# Patient Record
Sex: Female | Born: 1950 | Race: White | Hispanic: No | Marital: Married | Smoking: Never smoker
Health system: Southern US, Community
[De-identification: ages and names within clinical notes are randomized; demographics above are authoritative.]

## PROBLEM LIST (undated history)

## (undated) DIAGNOSIS — M810 Age-related osteoporosis without current pathological fracture: Secondary | ICD-10-CM

## (undated) DIAGNOSIS — I2510 Atherosclerotic heart disease of native coronary artery without angina pectoris: Secondary | ICD-10-CM

## (undated) DIAGNOSIS — I10 Essential (primary) hypertension: Secondary | ICD-10-CM

## (undated) DIAGNOSIS — E785 Hyperlipidemia, unspecified: Secondary | ICD-10-CM

## (undated) HISTORY — PX: CORONARY ANGIOPLASTY WITH STENT PLACEMENT: SHX49

## (undated) HISTORY — DX: Age-related osteoporosis without current pathological fracture: M81.0

---

## 2008-09-19 ENCOUNTER — Other Ambulatory Visit: Admission: RE | Admit: 2008-09-19 | Discharge: 2008-09-19 | Payer: Self-pay | Admitting: Family Medicine

## 2008-10-17 ENCOUNTER — Encounter: Admission: RE | Admit: 2008-10-17 | Discharge: 2008-10-17 | Payer: Self-pay | Admitting: Family Medicine

## 2009-10-18 ENCOUNTER — Encounter: Admission: RE | Admit: 2009-10-18 | Discharge: 2009-10-18 | Payer: Self-pay | Admitting: Family Medicine

## 2010-08-18 ENCOUNTER — Inpatient Hospital Stay (HOSPITAL_COMMUNITY)
Admission: EM | Admit: 2010-08-18 | Discharge: 2010-08-21 | Payer: Self-pay | Source: Home / Self Care | Attending: Internal Medicine | Admitting: Internal Medicine

## 2010-08-22 ENCOUNTER — Inpatient Hospital Stay (HOSPITAL_COMMUNITY)
Admission: EM | Admit: 2010-08-22 | Discharge: 2010-08-23 | Payer: Self-pay | Source: Home / Self Care | Attending: Cardiology | Admitting: Cardiology

## 2010-09-13 ENCOUNTER — Other Ambulatory Visit
Admission: RE | Admit: 2010-09-13 | Discharge: 2010-09-13 | Payer: Self-pay | Source: Home / Self Care | Admitting: Internal Medicine

## 2010-09-20 ENCOUNTER — Encounter (HOSPITAL_COMMUNITY)
Admission: RE | Admit: 2010-09-20 | Discharge: 2010-10-02 | Payer: Self-pay | Source: Home / Self Care | Attending: Cardiology | Admitting: Cardiology

## 2010-09-22 ENCOUNTER — Other Ambulatory Visit: Payer: Self-pay | Admitting: Internal Medicine

## 2010-09-22 DIAGNOSIS — Z1239 Encounter for other screening for malignant neoplasm of breast: Secondary | ICD-10-CM

## 2010-10-03 ENCOUNTER — Encounter (HOSPITAL_COMMUNITY): Payer: BC Managed Care – PPO | Attending: Cardiology

## 2010-10-03 ENCOUNTER — Encounter (HOSPITAL_COMMUNITY): Payer: BC Managed Care – PPO

## 2010-10-03 DIAGNOSIS — M949 Disorder of cartilage, unspecified: Secondary | ICD-10-CM | POA: Insufficient documentation

## 2010-10-03 DIAGNOSIS — I10 Essential (primary) hypertension: Secondary | ICD-10-CM | POA: Insufficient documentation

## 2010-10-03 DIAGNOSIS — Z9861 Coronary angioplasty status: Secondary | ICD-10-CM | POA: Insufficient documentation

## 2010-10-03 DIAGNOSIS — I471 Supraventricular tachycardia, unspecified: Secondary | ICD-10-CM | POA: Insufficient documentation

## 2010-10-03 DIAGNOSIS — E785 Hyperlipidemia, unspecified: Secondary | ICD-10-CM | POA: Insufficient documentation

## 2010-10-03 DIAGNOSIS — I2510 Atherosclerotic heart disease of native coronary artery without angina pectoris: Secondary | ICD-10-CM | POA: Insufficient documentation

## 2010-10-03 DIAGNOSIS — Z5189 Encounter for other specified aftercare: Secondary | ICD-10-CM | POA: Insufficient documentation

## 2010-10-03 DIAGNOSIS — M899 Disorder of bone, unspecified: Secondary | ICD-10-CM | POA: Insufficient documentation

## 2010-10-03 DIAGNOSIS — Z7982 Long term (current) use of aspirin: Secondary | ICD-10-CM | POA: Insufficient documentation

## 2010-10-05 ENCOUNTER — Encounter (HOSPITAL_COMMUNITY): Payer: BC Managed Care – PPO

## 2010-10-08 ENCOUNTER — Encounter (HOSPITAL_COMMUNITY): Payer: BC Managed Care – PPO

## 2010-10-10 ENCOUNTER — Encounter (HOSPITAL_COMMUNITY): Payer: BC Managed Care – PPO

## 2010-10-12 ENCOUNTER — Encounter (HOSPITAL_COMMUNITY): Payer: BC Managed Care – PPO

## 2010-10-15 ENCOUNTER — Encounter (HOSPITAL_COMMUNITY): Payer: BC Managed Care – PPO

## 2010-10-17 ENCOUNTER — Encounter (HOSPITAL_COMMUNITY): Payer: BC Managed Care – PPO

## 2010-10-19 ENCOUNTER — Encounter (HOSPITAL_COMMUNITY): Payer: BC Managed Care – PPO

## 2010-10-22 ENCOUNTER — Encounter (HOSPITAL_COMMUNITY): Payer: BC Managed Care – PPO

## 2010-10-22 ENCOUNTER — Other Ambulatory Visit: Payer: Self-pay

## 2010-10-22 ENCOUNTER — Ambulatory Visit: Payer: Self-pay

## 2010-10-23 ENCOUNTER — Ambulatory Visit
Admission: RE | Admit: 2010-10-23 | Discharge: 2010-10-23 | Disposition: A | Discharge status: Home / Self Care | Payer: BC Managed Care – PPO | Source: Ambulatory Visit | Attending: Internal Medicine | Admitting: Internal Medicine

## 2010-10-23 DIAGNOSIS — Z1239 Encounter for other screening for malignant neoplasm of breast: Secondary | ICD-10-CM

## 2010-10-24 ENCOUNTER — Encounter (HOSPITAL_COMMUNITY): Payer: BC Managed Care – PPO

## 2010-10-26 ENCOUNTER — Encounter (HOSPITAL_COMMUNITY): Payer: BC Managed Care – PPO

## 2010-10-29 ENCOUNTER — Encounter (HOSPITAL_COMMUNITY): Payer: BC Managed Care – PPO

## 2010-10-31 ENCOUNTER — Encounter (HOSPITAL_COMMUNITY): Payer: BC Managed Care – PPO

## 2010-11-01 NOTE — Consult Note (Signed)
Allison Downs, Allison Downs.:  [id number]  MEDICAL RECORD Downs.:  [id number]          PATIENT TYPE:  INP  LOCATION:  3702                         FACILITY:  [facility]  PHYSICIAN:  Learta Codding, MD,FACC DATE OF BIRTH:  03-16-1951  DATE OF CONSULTATION:  08/18/2010 DATE OF DISCHARGE:                                CONSULTATION   REFERRING PHYSICIAN:  Celene Kras, MD, in the emergency room.  REASON FOR CONSULTATION:  Evaluation of exertional substernal chest pain.  HISTORY OF PRESENT ILLNESS:  The patient is a very pleasant 60 year old female with Downs significant prior medical history.  The patient over the last several day started to experience episodes of substernal chest heaviness.  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  After she started walking at slow pace it went away. The pain then however came back intermittently in episodes of 5 minutes. Because of ongoing chest pain, the patient called her primary care physician at [facility] and was seen.  Everything checked out apparently within normal limits, but she was told she needed a stress test and was set up with [facility] but could not be seen until January 5.  The patient stated in ensuing days she continues to have substernal chest pain similar in duration and quality as her prior episodes.  This morning when she was walking to the store she developed chest tightness which lasted approximately 20 minutes which she described as moderate in nature approximately 7/10.  There was Downs associated shortness of breath or dizziness, but the pain was radiating into the back.  The rest relieved it.  She was concerned about and then presented to the emergency room.  In the ER, her EKG was within normal limits but she had Downs chest at that point in time.  Her initial blood work is also  within normal limits with normal cardiac markers including a myoglobin and troponin.  Interestingly, the patient has very few cardiac risk factors despite the fact that her histories are rather concerning for angina.  SOCIAL HISTORY:  The patient is a IT consultant.  She denies any tobacco use or alcohol use.  FAMILY HISTORY:  Mother had diabetes mellitus, died at age 10.  Father died in his 90s eventually from a myocardial infarction.  PAST MEDICAL HISTORY:  Notable for hypertension but Downs other significant disease illnesses including the episodes of diabetes mellitus.  The patient also stated that she had previously a normal cholesterol.  She has a history of bunion surgery.  ALLERGIES:  Downs known drug allergies.  REVIEW OF SYSTEMS:  As per HPI.  The patient denies any nausea or vomiting.  Downs fever or chills.  Downs melena or hematochezia.  Downs dysuria or frequency.  Downs palpitations or syncope.  The remainder of review of systems negative apart from pertinent positives.  PHYSICAL EXAMINATION:  VITAL SIGNS:  Blood pressure was 150/90, heart rate is 65 beats per minute, temperature is afebrile. GENERAL:  Well-nourished white female in Downs apparent distress. HEENT:  Pupils, eyes are clear.  Conjunctivae clear.  PERRLA.  EOMI. oropharynx is clear. NECK:  Supple.  Normal carotid upstroke.  Downs carotid bruits.  Downs thyromegaly.  Downs nodular thyroids.  JVP is approximately 5 cm. LYMPHADENOPATHY:  Downs cervical or supraclavicular lymphadenopathy. LUNGS:  Clear breath sounds bilaterally. HEART:  Regular rate and rhythm.  Normal S1 and S2.  Downs murmur, rubs, or gallops. ABDOMEN:  Soft, nontender.  Downs rebound or guarding.  Good bowel sounds. EXTREMITIES:  Downs cyanosis, clubbing or edema. NEURO:  The patient is alert oriented, and grossly nonfocal. SKIN:  Warm and dry. PSYCHIATRIC:  Normal affect.  LABORATORY WORK:  Sodium is 143, potassium is 3.3, chloride is 101, CO2 is 33, glucose 94, BUN is 13,  creatinine 0.84, troponin is less than 1, myoglobin is 62.8.  Urine is within normal limits.  A 12-lead electrocardiogram is within normal limits.  Chest x-ray shows evidence of remote lung disease, mild hyperinflation but Downs infiltrates, edema or effusions.  PROBLEM LIST: 1. Typical exertional chest pain, but in the absence of significant     risk factors. 2. Hypertension, somewhat poorly controlled. 3. Hypokalemia. 4. Downs significant family history of coronary artery disease.  PLAN: 1. The patient's history is rather concerning for exertional angina.     Her chest pain is rather typical, appears to come on with exertion,     has worsened over the last week.  This typically relieved by rest. 2. However, the patient has very few cardiac risk factors and her     pretest probability for coronary artery disease is only     intermediate.  However given her convincing history, I do feel that     the patient would be best served by proceeding with a diagnostic     cardiac catheterization to definitively rule out significant     coronary artery disease. 3. I spent considerable time with her and her husband to discuss the     risks and benefits of a cardiac catheterization versus a     noninvasive strategy based on the results of her troponins and     possible stress test.  The patient clearly was in favor of the     former test.  We discussed potential risks involved with cardiac     catheterization including bleeding, dye allergy myocardial     infarction, arrhythmia and possibly death.  The patient understands     the risk and wants to proceed with cardiac catheterization. 4. At this point year given the patient's low-dose nitroglycerin, I do     not think she needs a heparin per se at this point in time.  If she     has recurrent chest pain, this certainly can be started.  We will     keep her n.p.o. post midnight for diagnostic catheterization on     Monday.     Learta Codding,  MD,FACC     GED/MEDQ  D:  08/18/2010  T:  08/19/2010  Job:  [id number]  cc:   [facility] Hospitalist Service  Electronically Signed by Lewayne Bunting MDFACC on 11/01/2010 10:24:30 AM

## 2010-11-02 ENCOUNTER — Encounter (HOSPITAL_COMMUNITY): Payer: BC Managed Care – PPO | Attending: Cardiology

## 2010-11-02 DIAGNOSIS — E785 Hyperlipidemia, unspecified: Secondary | ICD-10-CM | POA: Insufficient documentation

## 2010-11-02 DIAGNOSIS — I471 Supraventricular tachycardia, unspecified: Secondary | ICD-10-CM | POA: Insufficient documentation

## 2010-11-02 DIAGNOSIS — M899 Disorder of bone, unspecified: Secondary | ICD-10-CM | POA: Insufficient documentation

## 2010-11-02 DIAGNOSIS — I2510 Atherosclerotic heart disease of native coronary artery without angina pectoris: Secondary | ICD-10-CM | POA: Insufficient documentation

## 2010-11-02 DIAGNOSIS — Z5189 Encounter for other specified aftercare: Secondary | ICD-10-CM | POA: Insufficient documentation

## 2010-11-02 DIAGNOSIS — I10 Essential (primary) hypertension: Secondary | ICD-10-CM | POA: Insufficient documentation

## 2010-11-02 DIAGNOSIS — Z9861 Coronary angioplasty status: Secondary | ICD-10-CM | POA: Insufficient documentation

## 2010-11-02 DIAGNOSIS — Z7982 Long term (current) use of aspirin: Secondary | ICD-10-CM | POA: Insufficient documentation

## 2010-11-05 ENCOUNTER — Encounter (HOSPITAL_COMMUNITY): Payer: BC Managed Care – PPO

## 2010-11-07 ENCOUNTER — Encounter (HOSPITAL_COMMUNITY): Payer: BC Managed Care – PPO

## 2010-11-09 ENCOUNTER — Encounter (HOSPITAL_COMMUNITY): Payer: BC Managed Care – PPO

## 2010-11-12 ENCOUNTER — Encounter (HOSPITAL_COMMUNITY): Payer: BC Managed Care – PPO

## 2010-11-12 LAB — TROPONIN I: Troponin I: 0.02 ng/mL (ref 0.00–0.06)

## 2010-11-12 LAB — POCT I-STAT, CHEM 8
Calcium, Ion: 1.02 mmol/L — ABNORMAL LOW (ref 1.12–1.32)
Creatinine, Ser: 0.9 mg/dL (ref 0.4–1.2)
Glucose, Bld: 118 mg/dL — ABNORMAL HIGH (ref 70–99)
Hemoglobin: 12.9 g/dL (ref 12.0–15.0)
Sodium: 139 mEq/L (ref 135–145)
TCO2: 25 mmol/L (ref 0–100)

## 2010-11-12 LAB — BASIC METABOLIC PANEL
CO2: 25 mEq/L (ref 19–32)
CO2: 30 mEq/L (ref 19–32)
Calcium: 10.5 mg/dL (ref 8.4–10.5)
Calcium: 8.3 mg/dL — ABNORMAL LOW (ref 8.4–10.5)
Chloride: 105 mEq/L (ref 96–112)
Chloride: 106 mEq/L (ref 96–112)
Chloride: 108 mEq/L (ref 96–112)
Creatinine, Ser: 0.84 mg/dL (ref 0.4–1.2)
Creatinine, Ser: 0.89 mg/dL (ref 0.4–1.2)
GFR calc Af Amer: >60 mL/min (ref >60)
GFR calc Af Amer: >60 mL/min (ref >60)
GFR calc Af Amer: >60 mL/min (ref >60)
GFR calc non Af Amer: >60 mL/min (ref >60)
Glucose, Bld: 102 mg/dL — ABNORMAL HIGH (ref 70–99)
Potassium: 3.6 mEq/L (ref 3.5–5.1)
Sodium: 140 mEq/L (ref 135–145)
Sodium: 141 mEq/L (ref 135–145)

## 2010-11-12 LAB — CBC
Hemoglobin: 11.1 g/dL — ABNORMAL LOW (ref 12.0–15.0)
MCH: 29.3 pg (ref 26.0–34.0)
MCH: 29.4 pg (ref 26.0–34.0)
MCHC: 34 g/dL (ref 30.0–36.0)
MCV: 86.2 fL (ref 78.0–100.0)
Platelets: 238 10*3/uL (ref 150–400)
Platelets: 245 10*3/uL (ref 150–400)
RBC: 4.1 MIL/uL (ref 3.87–5.11)
RBC: 4.71 MIL/uL (ref 3.87–5.11)
WBC: 6.2 10*3/uL (ref 4.0–10.5)
WBC: 7.4 10*3/uL (ref 4.0–10.5)

## 2010-11-12 LAB — CARDIAC PANEL(CRET KIN+CKTOT+MB+TROPI)
Relative Index: INVALID (ref 0.0–2.5)
Relative Index: INVALID (ref 0.0–2.5)
Relative Index: INVALID (ref 0.0–2.5)
Total CK: 34 U/L (ref 7–177)
Total CK: 41 U/L (ref 7–177)
Total CK: 60 U/L (ref 7–177)
Troponin I: 0.01 ng/mL (ref 0.00–0.06)
Troponin I: 0.02 ng/mL (ref 0.00–0.06)
Troponin I: 0.06 ng/mL (ref 0.00–0.06)

## 2010-11-12 LAB — DIFFERENTIAL
Eosinophils Absolute: 0 10*3/uL (ref 0.0–0.7)
Lymphocytes Relative: 20 % (ref 12–46)
Lymphs Abs: 1.5 10*3/uL (ref 0.7–4.0)
Neutro Abs: 5.4 10*3/uL (ref 1.7–7.7)
Neutrophils Relative %: 73 % (ref 43–77)

## 2010-11-12 LAB — POCT CARDIAC MARKERS
Myoglobin, poc: 52.8 ng/mL (ref 12–200)
Myoglobin, poc: 79.7 ng/mL (ref 12–200)

## 2010-11-12 LAB — URINALYSIS, ROUTINE W REFLEX MICROSCOPIC
Glucose, UA: NEGATIVE mg/dL
Ketones, ur: NEGATIVE mg/dL
Protein, ur: NEGATIVE mg/dL

## 2010-11-12 LAB — CK TOTAL AND CKMB (NOT AT ARMC)
CK, MB: 0.7 ng/mL (ref 0.3–4.0)
CK, MB: 1 ng/mL (ref 0.3–4.0)
Relative Index: INVALID (ref 0.0–2.5)
Total CK: 106 U/L (ref 7–177)
Total CK: 48 U/L (ref 7–177)

## 2010-11-12 LAB — LIPID PANEL
Cholesterol: 207 mg/dL — ABNORMAL HIGH (ref 0–200)
LDL Cholesterol: 130 mg/dL — ABNORMAL HIGH (ref 0–99)
Triglycerides: 91 mg/dL (ref <150)
VLDL: 18 mg/dL (ref 0–40)

## 2010-11-12 LAB — URINE CULTURE
Colony Count: NO GROWTH
Culture: NO GROWTH

## 2010-11-12 LAB — PROTIME-INR
INR: 0.96 (ref 0.00–1.49)
Prothrombin Time: 13 seconds (ref 11.6–15.2)

## 2010-11-14 ENCOUNTER — Encounter (HOSPITAL_COMMUNITY): Payer: BC Managed Care – PPO

## 2010-11-16 ENCOUNTER — Encounter (HOSPITAL_COMMUNITY): Payer: BC Managed Care – PPO

## 2010-11-19 ENCOUNTER — Encounter (HOSPITAL_COMMUNITY): Payer: BC Managed Care – PPO

## 2010-11-21 ENCOUNTER — Encounter (HOSPITAL_COMMUNITY): Payer: BC Managed Care – PPO

## 2010-11-23 ENCOUNTER — Encounter (HOSPITAL_COMMUNITY): Payer: BC Managed Care – PPO

## 2010-11-26 ENCOUNTER — Encounter (HOSPITAL_COMMUNITY): Payer: BC Managed Care – PPO

## 2010-11-28 ENCOUNTER — Encounter (HOSPITAL_COMMUNITY): Payer: BC Managed Care – PPO

## 2010-11-30 ENCOUNTER — Encounter (HOSPITAL_COMMUNITY): Payer: BC Managed Care – PPO

## 2010-12-03 ENCOUNTER — Encounter (HOSPITAL_COMMUNITY): Payer: BC Managed Care – PPO

## 2010-12-05 ENCOUNTER — Encounter (HOSPITAL_COMMUNITY): Payer: BC Managed Care – PPO | Attending: Cardiology

## 2010-12-05 DIAGNOSIS — E785 Hyperlipidemia, unspecified: Secondary | ICD-10-CM | POA: Insufficient documentation

## 2010-12-05 DIAGNOSIS — Z5189 Encounter for other specified aftercare: Secondary | ICD-10-CM | POA: Insufficient documentation

## 2010-12-05 DIAGNOSIS — Z9861 Coronary angioplasty status: Secondary | ICD-10-CM | POA: Insufficient documentation

## 2010-12-05 DIAGNOSIS — I471 Supraventricular tachycardia, unspecified: Secondary | ICD-10-CM | POA: Insufficient documentation

## 2010-12-05 DIAGNOSIS — M899 Disorder of bone, unspecified: Secondary | ICD-10-CM | POA: Insufficient documentation

## 2010-12-05 DIAGNOSIS — Z7982 Long term (current) use of aspirin: Secondary | ICD-10-CM | POA: Insufficient documentation

## 2010-12-05 DIAGNOSIS — I2510 Atherosclerotic heart disease of native coronary artery without angina pectoris: Secondary | ICD-10-CM | POA: Insufficient documentation

## 2010-12-05 DIAGNOSIS — I10 Essential (primary) hypertension: Secondary | ICD-10-CM | POA: Insufficient documentation

## 2010-12-05 DIAGNOSIS — M949 Disorder of cartilage, unspecified: Secondary | ICD-10-CM | POA: Insufficient documentation

## 2010-12-07 ENCOUNTER — Encounter (HOSPITAL_COMMUNITY): Payer: BC Managed Care – PPO

## 2010-12-10 ENCOUNTER — Encounter (HOSPITAL_COMMUNITY): Payer: BC Managed Care – PPO

## 2010-12-12 ENCOUNTER — Encounter (HOSPITAL_COMMUNITY): Payer: BC Managed Care – PPO

## 2010-12-14 ENCOUNTER — Encounter (HOSPITAL_COMMUNITY): Payer: BC Managed Care – PPO

## 2010-12-17 ENCOUNTER — Encounter (HOSPITAL_COMMUNITY): Payer: BC Managed Care – PPO

## 2010-12-19 ENCOUNTER — Encounter (HOSPITAL_COMMUNITY): Payer: BC Managed Care – PPO

## 2010-12-21 ENCOUNTER — Encounter (HOSPITAL_COMMUNITY): Payer: BC Managed Care – PPO

## 2010-12-24 ENCOUNTER — Encounter (HOSPITAL_COMMUNITY): Payer: BC Managed Care – PPO

## 2010-12-26 ENCOUNTER — Encounter (HOSPITAL_COMMUNITY): Payer: BC Managed Care – PPO

## 2010-12-28 ENCOUNTER — Encounter (HOSPITAL_COMMUNITY): Payer: BC Managed Care – PPO

## 2011-09-17 ENCOUNTER — Other Ambulatory Visit (HOSPITAL_COMMUNITY)
Admission: RE | Admit: 2011-09-17 | Discharge: 2011-09-17 | Disposition: A | Discharge status: Home / Self Care | Payer: BC Managed Care – PPO | Source: Ambulatory Visit | Attending: Family Medicine | Admitting: Family Medicine

## 2011-09-17 DIAGNOSIS — Z01419 Encounter for gynecological examination (general) (routine) without abnormal findings: Secondary | ICD-10-CM | POA: Insufficient documentation

## 2011-09-19 ENCOUNTER — Other Ambulatory Visit: Payer: Self-pay | Admitting: Family Medicine

## 2011-09-19 DIAGNOSIS — Z1231 Encounter for screening mammogram for malignant neoplasm of breast: Secondary | ICD-10-CM

## 2011-10-25 ENCOUNTER — Ambulatory Visit
Admission: RE | Admit: 2011-10-25 | Discharge: 2011-10-25 | Disposition: A | Discharge status: Home / Self Care | Payer: BC Managed Care – PPO | Source: Ambulatory Visit | Attending: Family Medicine | Admitting: Family Medicine

## 2011-10-25 DIAGNOSIS — Z1231 Encounter for screening mammogram for malignant neoplasm of breast: Secondary | ICD-10-CM

## 2011-10-29 ENCOUNTER — Other Ambulatory Visit: Payer: Self-pay | Admitting: Family Medicine

## 2011-10-29 DIAGNOSIS — R928 Other abnormal and inconclusive findings on diagnostic imaging of breast: Secondary | ICD-10-CM

## 2011-11-07 ENCOUNTER — Other Ambulatory Visit: Payer: Self-pay | Admitting: Family Medicine

## 2011-11-07 ENCOUNTER — Ambulatory Visit
Admission: RE | Admit: 2011-11-07 | Discharge: 2011-11-07 | Disposition: A | Discharge status: Home / Self Care | Payer: BC Managed Care – PPO | Source: Ambulatory Visit | Attending: Family Medicine | Admitting: Family Medicine

## 2011-11-07 DIAGNOSIS — R928 Other abnormal and inconclusive findings on diagnostic imaging of breast: Secondary | ICD-10-CM

## 2012-06-14 ENCOUNTER — Emergency Department (HOSPITAL_COMMUNITY)
Admission: EM | Admit: 2012-06-14 | Discharge: 2012-06-14 | Disposition: A | Discharge status: Home / Self Care | Payer: BC Managed Care – PPO | Source: Home / Self Care

## 2012-06-14 ENCOUNTER — Encounter (HOSPITAL_COMMUNITY): Payer: Self-pay | Admitting: *Deleted

## 2012-06-14 DIAGNOSIS — S60219A Contusion of unspecified wrist, initial encounter: Secondary | ICD-10-CM

## 2012-06-14 HISTORY — DX: Essential (primary) hypertension: I10

## 2012-06-14 HISTORY — DX: Hyperlipidemia, unspecified: E78.5

## 2012-06-14 HISTORY — DX: Atherosclerotic heart disease of native coronary artery without angina pectoris: I25.10

## 2012-06-14 NOTE — ED Notes (Signed)
Drove back from [place] today; this afternoon noticed bruise and swelling to right wrist area without known injury.  Pt is currently on clopidogrel; states area has not continued to grow - feels it may have decreased some after ice application.  Denies pain.

## 2012-06-14 NOTE — ED Provider Notes (Signed)
Medical screening examination/treatment/procedure(s) were performed by resident physician or non-physician practitioner and as supervising physician I was immediately available for consultation/collaboration.   KINDL,JAMES DOUGLAS MD.    James D Kindl, MD 06/14/12 1922 

## 2012-06-14 NOTE — ED Provider Notes (Signed)
History     CSN: [id number]  Arrival date & time 06/14/12  1655   None     Chief Complaint  Patient presents with  . Hematoma     (Consider location/radiation/quality/duration/timing/severity/associated sxs/prior treatment) HPI Comments: Pleasant 61 year old female noticed a discolored swollen area to her right dorsal wrist today. She is on Plavix and states she bruises easily. He doesn't remember striking her right wrist on anything but did notice the hematoma this afternoon around 2:00. She comes for her a diagnosis and reassurance. She denies wrist pain.   Past Medical History  Diagnosis Date  . Hypertension   . Coronary artery disease   . Hyperlipemia     Past Surgical History  Procedure Date  . Coronary angioplasty with stent placement     No family history on file.  History  Substance Use Topics  . Smoking status: Not on file  . Smokeless tobacco: Not on file  . Alcohol Use: No    OB History    Grav Para Term Preterm Abortions TAB SAB Ect Mult Living                  Review of Systems  Constitutional: Negative for fever, chills and activity change.  HENT: Negative.   Respiratory: Negative.   Cardiovascular: Negative.   Musculoskeletal: Negative for back pain, joint swelling, arthralgias and gait problem.       As per HPI  Skin: Positive for color change. Negative for pallor and rash.       As per history of present illness  Neurological: Negative.     Allergies  Augmentin  Home Medications   Current Outpatient Rx  Name Route Sig Dispense Refill  . ALENDRONATE SODIUM 70 MG PO TABS Oral Take 70 mg by mouth every 7 (seven) days. Take with a full glass of water on an empty stomach.    . CLOPIDOGREL BISULFATE 75 MG PO TABS Oral Take 75 mg by mouth daily.    . ISOSORBIDE MONONITRATE ER 30 MG PO TB24 Oral Take 30 mg by mouth daily.    Marland Kitchen LISINOPRIL 20 MG PO TABS Oral Take 20 mg by mouth 2 (two) times daily.    Marland Kitchen METOPROLOL SUCCINATE ER 25 MG PO TB24  Oral Take 25 mg by mouth 2 (two) times daily. 1/2 tab    . SIMVASTATIN 20 MG PO TABS Oral Take 20 mg by mouth every evening.      BP 182/70  Pulse 99  Temp 98.5 F (36.9 C) (Oral)  Resp 18  SpO2 100%  Physical Exam  Constitutional: She is oriented to person, place, and time. She appears well-developed and well-nourished. No distress.  HENT:  Head: Normocephalic and atraumatic.  Eyes: EOM are normal. Pupils are equal, round, and reactive to light.  Neck: Normal range of motion. Neck supple.  Lymphadenopathy:    She has no cervical adenopathy.  Neurological: She is alert and oriented to person, place, and time. No cranial nerve deficit.  Skin: Skin is warm and dry.       Slightly bluish area of swelling to the dorsum of the right wrist. This area is soft and nontender. It is well circumscribed.    ED Course  Procedures (including critical care time)  Labs Reviewed - No data to display No results found.   1. Traumatic hematoma of wrist       MDM   reassurance May apply ice packs and pressure off and on tonight. It will  take several days to couple weeks for the hematoma to resolve.         Hayden Rasmussen, NP 06/14/12 1816

## 2012-09-28 ENCOUNTER — Other Ambulatory Visit (HOSPITAL_COMMUNITY)
Admission: RE | Admit: 2012-09-28 | Discharge: 2012-09-28 | Disposition: A | Discharge status: Home / Self Care | Payer: BC Managed Care – PPO | Source: Ambulatory Visit | Attending: Family Medicine | Admitting: Family Medicine

## 2012-09-28 ENCOUNTER — Other Ambulatory Visit: Payer: Self-pay | Admitting: Family Medicine

## 2012-09-28 DIAGNOSIS — Z0000 Encounter for general adult medical examination without abnormal findings: Secondary | ICD-10-CM | POA: Insufficient documentation

## 2012-09-28 DIAGNOSIS — M810 Age-related osteoporosis without current pathological fracture: Secondary | ICD-10-CM

## 2012-09-28 DIAGNOSIS — Z780 Asymptomatic menopausal state: Secondary | ICD-10-CM

## 2012-09-28 DIAGNOSIS — Z1231 Encounter for screening mammogram for malignant neoplasm of breast: Secondary | ICD-10-CM

## 2012-11-10 ENCOUNTER — Ambulatory Visit
Admission: RE | Admit: 2012-11-10 | Discharge: 2012-11-10 | Disposition: A | Discharge status: Home / Self Care | Payer: BC Managed Care – PPO | Source: Ambulatory Visit | Attending: Family Medicine | Admitting: Family Medicine

## 2012-11-10 DIAGNOSIS — Z780 Asymptomatic menopausal state: Secondary | ICD-10-CM

## 2012-11-10 DIAGNOSIS — Z1231 Encounter for screening mammogram for malignant neoplasm of breast: Secondary | ICD-10-CM

## 2012-11-10 DIAGNOSIS — M810 Age-related osteoporosis without current pathological fracture: Secondary | ICD-10-CM

## 2013-07-09 ENCOUNTER — Telehealth: Payer: Self-pay | Admitting: Cardiology

## 2013-07-09 NOTE — Telephone Encounter (Signed)
New message    Need pres faxed to [facility] script-----simvastatin---77mo supply

## 2013-07-12 ENCOUNTER — Other Ambulatory Visit: Payer: Self-pay

## 2013-07-12 ENCOUNTER — Telehealth: Payer: Self-pay

## 2013-07-12 MED ORDER — SIMVASTATIN 20 MG PO TABS: 20.0000 mg | ORAL_TABLET | Freq: Every evening | ORAL | Status: DC

## 2013-07-12 NOTE — Telephone Encounter (Signed)
Rx refilled.

## 2013-07-12 NOTE — Telephone Encounter (Signed)
See note

## 2013-09-27 ENCOUNTER — Telehealth: Payer: Self-pay

## 2013-09-27 ENCOUNTER — Other Ambulatory Visit: Payer: Self-pay

## 2013-09-27 MED ORDER — LISINOPRIL 20 MG PO TABS: 20.0000 mg | ORAL_TABLET | Freq: Two times a day (BID) | ORAL | Status: DC

## 2013-09-27 NOTE — Telephone Encounter (Signed)
Medication refilled by Rose earlier today.

## 2013-09-27 NOTE — Telephone Encounter (Signed)
You can refill. Pt takes lisinopril 20 mg twice a day with 6 refills. Pt should have an appt for follow up 7/15

## 2013-10-08 ENCOUNTER — Other Ambulatory Visit: Payer: Self-pay

## 2013-10-08 DIAGNOSIS — Z1231 Encounter for screening mammogram for malignant neoplasm of breast: Secondary | ICD-10-CM

## 2013-10-20 ENCOUNTER — Other Ambulatory Visit: Payer: Self-pay | Admitting: Cardiology

## 2013-10-27 ENCOUNTER — Telehealth: Payer: Self-pay

## 2013-10-29 ENCOUNTER — Other Ambulatory Visit: Payer: Self-pay

## 2013-10-29 MED ORDER — METOPROLOL SUCCINATE ER 25 MG PO TB24: ORAL_TABLET | ORAL | Status: DC

## 2013-11-02 ENCOUNTER — Other Ambulatory Visit: Payer: Self-pay

## 2013-11-02 MED ORDER — METOPROLOL TARTRATE 25 MG PO TABS: ORAL_TABLET | ORAL | Status: DC

## 2013-11-02 NOTE — Telephone Encounter (Signed)
Rx has been filled 

## 2013-11-11 ENCOUNTER — Ambulatory Visit
Admission: RE | Admit: 2013-11-11 | Discharge: 2013-11-11 | Disposition: A | Discharge status: Home / Self Care | Payer: BC Managed Care – PPO | Source: Ambulatory Visit

## 2013-11-11 DIAGNOSIS — Z1231 Encounter for screening mammogram for malignant neoplasm of breast: Secondary | ICD-10-CM

## 2013-12-08 ENCOUNTER — Other Ambulatory Visit: Payer: Self-pay | Admitting: *Deleted

## 2013-12-08 NOTE — Telephone Encounter (Signed)
Patient calling to have clopidogrel 75 refilled.  Will route message to Dr [name] nurse

## 2013-12-10 MED ORDER — CLOPIDOGREL BISULFATE 75 MG PO TABS: 75.0000 mg | ORAL_TABLET | Freq: Every day | ORAL | Status: DC

## 2014-02-01 ENCOUNTER — Other Ambulatory Visit: Payer: Self-pay | Admitting: *Deleted

## 2014-02-01 DIAGNOSIS — E7800 Pure hypercholesterolemia, unspecified: Secondary | ICD-10-CM

## 2014-02-01 DIAGNOSIS — Z79899 Other long term (current) drug therapy: Secondary | ICD-10-CM

## 2014-03-02 ENCOUNTER — Other Ambulatory Visit (INDEPENDENT_AMBULATORY_CARE_PROVIDER_SITE_OTHER): Payer: BC Managed Care – PPO

## 2014-03-02 DIAGNOSIS — Z79899 Other long term (current) drug therapy: Secondary | ICD-10-CM

## 2014-03-02 DIAGNOSIS — E7800 Pure hypercholesterolemia, unspecified: Secondary | ICD-10-CM

## 2014-03-02 LAB — HEPATIC FUNCTION PANEL
ALK PHOS: 31 U/L — AB (ref 39–117)
ALT: 21 U/L (ref 0–35)
AST: 27 U/L (ref 0–37)
Albumin: 4 g/dL (ref 3.5–5.2)
BILIRUBIN DIRECT: 0 mg/dL (ref 0.0–0.3)
TOTAL PROTEIN: 6.6 g/dL (ref 6.0–8.3)
Total Bilirubin: 1 mg/dL (ref 0.2–1.2)

## 2014-03-02 LAB — LIPID PANEL
CHOLESTEROL: 164 mg/dL (ref 0–200)
HDL: 60.8 mg/dL (ref >39.00)
LDL Cholesterol: 86 mg/dL (ref 0–99)
NonHDL: 103.2
TRIGLYCERIDES: 86 mg/dL (ref 0.0–149.0)
Total CHOL/HDL Ratio: 3
VLDL: 17.2 mg/dL (ref 0.0–40.0)

## 2014-03-09 ENCOUNTER — Ambulatory Visit (INDEPENDENT_AMBULATORY_CARE_PROVIDER_SITE_OTHER): Payer: BC Managed Care – PPO | Admitting: Cardiology

## 2014-03-09 ENCOUNTER — Encounter (INDEPENDENT_AMBULATORY_CARE_PROVIDER_SITE_OTHER): Payer: Self-pay

## 2014-03-09 ENCOUNTER — Encounter: Payer: Self-pay | Admitting: Cardiology

## 2014-03-09 VITALS — BP 120/76 | HR 48 | Ht 60.0 in | Wt 97.4 lb

## 2014-03-09 DIAGNOSIS — R001 Bradycardia, unspecified: Secondary | ICD-10-CM

## 2014-03-09 DIAGNOSIS — I2510 Atherosclerotic heart disease of native coronary artery without angina pectoris: Secondary | ICD-10-CM

## 2014-03-09 DIAGNOSIS — I10 Essential (primary) hypertension: Secondary | ICD-10-CM

## 2014-03-09 DIAGNOSIS — E7800 Pure hypercholesterolemia, unspecified: Secondary | ICD-10-CM

## 2014-03-09 DIAGNOSIS — I498 Other specified cardiac arrhythmias: Secondary | ICD-10-CM

## 2014-03-09 NOTE — Progress Notes (Signed)
[address] [place], [place]  [address] Phone: [phone] Fax:  [phone]  Date:  03/09/2014   ID:  Allison FritzKathlyn Downs, DOB [birth year], MRN [id number]  PCP:  Allison SaupeFULP, CAMMIE, MD   History of Present Illness: Allison Downs is a 63 y.o. female with history of CAD 12/11, DES to LAD at the bifurcation of the first diagonal branch who came back to the hospital the subsequent day and was ruled out for MI, and HTN.  She is doing very well. Active. Hiking. No anginal symptoms with moderate exertion. She is on both Plavix and aspirin. We had discussion about the possible discontinuation of Plavix however given the position of her stent essentially in the proximal LAD and smaller diameter of the stent a 2.75 mm I would like to continue it at this point. She's not had any bleeding episodes.  Her blood pressure one point was elevated and we increased the lisinopril to 20 mg twice a day. Since then, she has been doing very well.  Her lipids are also under excellent control with simvastatin 20 mg. Lab work reviewed with her..  Patient denies chest pain, palpitations, dizziness, syncope, swelling, nor PND.    Wt Readings from Last 3 Encounters:  03/09/14 97 lb 6.4 oz (44.18 kg)     Past Medical History  Diagnosis Date  . Hypertension   . Osteoporosis   . Coronary artery disease     drug-eluting stent to mid LAD (2.5975mm)at the bifurcation of first diagonal branch .December 19,2011-decided to continue Plavix   . Hyperlipemia     LDL goal less than 70    Past Surgical History  Procedure Laterality Date  . Coronary angioplasty with stent placement      Current Outpatient Prescriptions  Medication Sig Dispense Refill  . alendronate (FOSAMAX) 70 MG tablet Take 70 mg by mouth every 7 (seven) days. Take with a full glass of water on an empty stomach.      Marland Kitchen. aspirin 81 MG EC tablet Take 81 mg by mouth daily. Swallow whole.      . Calcium Carbonate-Vit D-Min (CALCIUM 1200  PO) Take by mouth. Three chewables once daily      . clopidogrel (PLAVIX) 75 MG tablet Take 1 tablet (75 mg total) by mouth daily.  90 tablet  2  . isosorbide mononitrate (IMDUR) 30 MG 24 hr tablet Take 30 mg by mouth daily.      Marland Kitchen. KRILL OIL PO Take by mouth. 300mg  1 tab daily      . lisinopril (PRINIVIL,ZESTRIL) 20 MG tablet Take 1 tablet (20 mg total) by mouth 2 (two) times daily.  180 tablet  3  . metoprolol tartrate (LOPRESSOR) 25 MG tablet Take 1/2 tablet twice a day  180 tablet  3  . montelukast (SINGULAIR) 10 MG tablet 20 mg.       . Multiple Vitamin (MULTI-VITAMIN PO) Take by mouth. Take 1 tab daily      . simvastatin (ZOCOR) 20 MG tablet Take 1 tablet (20 mg total) by mouth every evening.  90 tablet  3   No current facility-administered medications for this visit.    Allergies:    Allergies  Allergen Reactions  . Augmentin [Amoxicillin-Pot Clavulanate] Diarrhea  . Ibuprofen     Upset stomach    Social History:  The patient  reports that she has never smoked. She does not have any smokeless tobacco history on file. She reports that she does  not drink alcohol or use illicit drugs.   Family History  Problem Relation Age of Onset  . Diabetes Mother   . Hypertension Mother   . Coronary artery disease Mother   . Heart attack Father   . Dementia Father   . Coronary artery disease Father     ROS:  Please see the history of present illness.   Denies any syncope, bleeding, orthopnea, PND   All other systems reviewed and negative.   PHYSICAL EXAM: VS:  BP 120/76  Pulse 48  Ht 5' (1.524 m)  Wt 97 lb 6.4 oz (44.18 kg)  BMI 19.02 kg/m2 Thin, in no acute distress HEENT: normal, [place]/AT, EOMI Neck: no JVD, normal carotid upstroke, no bruit Cardiac:  normal S1, S2; RRR; no murmur Lungs:  clear to auscultation bilaterally, no wheezing, rhonchi or rales Abd: soft, nontender, no hepatomegaly, no bruits Ext: no edema, 2+ distal pulses Skin: warm and dry GU: deferred Neuro: no  focal abnormalities noted, AAO x 3  EKG:  03/09/14-sinus bradycardia rate 48 with no other significant abnormalities    Labs: 03/02/14-LDL 86, HDL 60  ASSESSMENT AND PLAN:  1. Coronary artery disease-drug-eluting stent in LAD. Given the proximity of the LAD stent, we will go ahead and continue with Plavix. Aspirin. She is doing well without anginal symptoms. She is on very low-dose beta blockers. Unable to titrate because of bradycardia. Asymptomatic. Isosorbide. 2. Bradycardia-asymptomatic. Low-dose beta blocker. 3. Hyperlipidemia-well-controlled on low-dose simvastatin. LDL 86. Optimal less than 70 4. Hypertension-well-controlled. 5. 1 year follow up.  Signed, Allison SchultzMark Dakarai Mcglocklin, MD [facility]  03/09/2014 9:04 AM

## 2014-03-09 NOTE — Patient Instructions (Signed)
The current medical regimen is effective;  continue present plan and medications.  Follow up in 1 year with Dr Skains.  You will receive a letter in the mail 2 months before you are due.  Please call us when you receive this letter to schedule your follow up appointment.  

## 2014-05-12 ENCOUNTER — Other Ambulatory Visit: Payer: Self-pay | Admitting: Cardiology

## 2014-07-12 ENCOUNTER — Other Ambulatory Visit: Payer: Self-pay

## 2014-07-12 MED ORDER — SIMVASTATIN 20 MG PO TABS: 20.0000 mg | ORAL_TABLET | Freq: Every evening | ORAL | Status: DC

## 2014-09-08 ENCOUNTER — Other Ambulatory Visit: Payer: Self-pay | Admitting: Cardiology

## 2014-09-29 ENCOUNTER — Other Ambulatory Visit: Payer: Self-pay | Admitting: Family Medicine

## 2014-09-29 DIAGNOSIS — Z1231 Encounter for screening mammogram for malignant neoplasm of breast: Secondary | ICD-10-CM

## 2014-09-29 DIAGNOSIS — M810 Age-related osteoporosis without current pathological fracture: Secondary | ICD-10-CM

## 2014-11-15 ENCOUNTER — Ambulatory Visit
Admission: RE | Admit: 2014-11-15 | Discharge: 2014-11-15 | Disposition: A | Discharge status: Home / Self Care | Payer: BLUE CROSS/BLUE SHIELD | Source: Ambulatory Visit | Attending: Family Medicine | Admitting: Family Medicine

## 2014-11-15 DIAGNOSIS — M810 Age-related osteoporosis without current pathological fracture: Secondary | ICD-10-CM

## 2014-11-15 DIAGNOSIS — Z1231 Encounter for screening mammogram for malignant neoplasm of breast: Secondary | ICD-10-CM

## 2015-01-05 ENCOUNTER — Telehealth: Payer: Self-pay | Admitting: Cardiology

## 2015-01-05 DIAGNOSIS — E7800 Pure hypercholesterolemia, unspecified: Secondary | ICD-10-CM

## 2015-01-05 DIAGNOSIS — I10 Essential (primary) hypertension: Secondary | ICD-10-CM

## 2015-01-05 DIAGNOSIS — Z79899 Other long term (current) drug therapy: Secondary | ICD-10-CM

## 2015-01-05 NOTE — Telephone Encounter (Signed)
CMET and lipid panel thx  Donato SchultzSKAINS, MARK, MD

## 2015-01-05 NOTE — Telephone Encounter (Signed)
New Message        Pt states that she normally has labs done when she comes to see Dr. [name] but there are no orders for labs in Epic. Pt wants to make sure that she is supposed to have labs. Please put order in Epic, if so.

## 2015-01-05 NOTE — Telephone Encounter (Signed)
Will ask Dr [name] what lab work he would like.  She is on simvastatin and lisinopril

## 2015-01-10 NOTE — Telephone Encounter (Signed)
Pt aware lab orders to be placed and her appt is 7/8.  She denied any other questions and thanked me for my time.

## 2015-01-13 ENCOUNTER — Other Ambulatory Visit: Payer: Self-pay | Admitting: Cardiology

## 2015-02-06 ENCOUNTER — Other Ambulatory Visit: Payer: Self-pay | Admitting: Cardiology

## 2015-02-07 NOTE — Telephone Encounter (Signed)
Per note 7.8.15

## 2015-03-10 ENCOUNTER — Other Ambulatory Visit (INDEPENDENT_AMBULATORY_CARE_PROVIDER_SITE_OTHER): Payer: BLUE CROSS/BLUE SHIELD | Admitting: *Deleted

## 2015-03-10 DIAGNOSIS — I10 Essential (primary) hypertension: Secondary | ICD-10-CM

## 2015-03-10 DIAGNOSIS — Z79899 Other long term (current) drug therapy: Secondary | ICD-10-CM

## 2015-03-10 DIAGNOSIS — E7800 Pure hypercholesterolemia, unspecified: Secondary | ICD-10-CM

## 2015-03-10 LAB — LIPID PANEL
Cholesterol: 154 mg/dL (ref 0–200)
HDL: 58.7 mg/dL (ref >39.00)
LDL CALC: 75 mg/dL (ref 0–99)
NonHDL: 95.3
TRIGLYCERIDES: 100 mg/dL (ref 0.0–149.0)
Total CHOL/HDL Ratio: 3
VLDL: 20 mg/dL (ref 0.0–40.0)

## 2015-03-10 LAB — COMPREHENSIVE METABOLIC PANEL
ALT: 21 U/L (ref 0–35)
AST: 24 U/L (ref 0–37)
Albumin: 3.9 g/dL (ref 3.5–5.2)
Alkaline Phosphatase: 40 U/L (ref 39–117)
BILIRUBIN TOTAL: 0.6 mg/dL (ref 0.2–1.2)
BUN: 20 mg/dL (ref 6–23)
CALCIUM: 9.6 mg/dL (ref 8.4–10.5)
CO2: 34 mEq/L — ABNORMAL HIGH (ref 19–32)
CREATININE: 1.1 mg/dL (ref 0.40–1.20)
Chloride: 102 mEq/L (ref 96–112)
GFR: 53.03 mL/min — AB (ref >60.00)
GLUCOSE: 84 mg/dL (ref 70–99)
Potassium: 3.7 mEq/L (ref 3.5–5.1)
Sodium: 142 mEq/L (ref 135–145)
Total Protein: 6.3 g/dL (ref 6.0–8.3)

## 2015-03-16 ENCOUNTER — Encounter: Payer: Self-pay | Admitting: Cardiology

## 2015-03-16 ENCOUNTER — Ambulatory Visit (INDEPENDENT_AMBULATORY_CARE_PROVIDER_SITE_OTHER): Payer: BLUE CROSS/BLUE SHIELD | Admitting: Cardiology

## 2015-03-16 ENCOUNTER — Other Ambulatory Visit: Payer: Self-pay | Admitting: *Deleted

## 2015-03-16 VITALS — BP 130/76 | HR 50 | Ht 60.0 in | Wt 99.5 lb

## 2015-03-16 DIAGNOSIS — R001 Bradycardia, unspecified: Secondary | ICD-10-CM | POA: Diagnosis not present

## 2015-03-16 DIAGNOSIS — I10 Essential (primary) hypertension: Secondary | ICD-10-CM

## 2015-03-16 DIAGNOSIS — E7800 Pure hypercholesterolemia, unspecified: Secondary | ICD-10-CM

## 2015-03-16 DIAGNOSIS — I2510 Atherosclerotic heart disease of native coronary artery without angina pectoris: Secondary | ICD-10-CM | POA: Diagnosis not present

## 2015-03-16 MED ORDER — CLOPIDOGREL BISULFATE 75 MG PO TABS: 75.0000 mg | ORAL_TABLET | Freq: Every day | ORAL | Status: DC

## 2015-03-16 NOTE — Progress Notes (Signed)
[address] [place], [place]  [address] Phone: [phone] Fax:  [phone]  Date:  03/16/2015   ID:  Allison Downs, DOB 11/14/1950, MRN [id number]  PCP:  Cain Saupe, MD   History of Present Illness: Allison Downs is a 64 y.o. female with history of CAD 12/11, DES to LAD at the bifurcation of the first diagonal branch who came back to the hospital the subsequent day and was ruled out for MI, and HTN.  She is doing very well. Active. Hiking. No anginal symptoms with moderate exertion. She is on both Plavix and aspirin. We had discussion about the possible discontinuation of Plavix however given the position of her stent essentially in the proximal LAD and smaller diameter of the stent a 2.75 mm I would like to continue it at this point. She's not had any bleeding episodes.  Her blood pressure one point was elevated and we increased the lisinopril to 20 mg twice a day. Since then, she has been doing very well.  Her lipids are also under excellent control with simvastatin 20 mg. Lab work reviewed with her.  Patient denies chest pain, palpitations, dizziness, syncope, swelling, nor PND.  She's not having any anginal symptoms. We will stop the Imdur.    Wt Readings from Last 3 Encounters:  03/16/15 99 lb 8 oz (45.133 kg)  03/09/14 97 lb 6.4 oz (44.18 kg)     Past Medical History  Diagnosis Date  . Hypertension   . Osteoporosis   . Coronary artery disease     drug-eluting stent to mid LAD (2.41mm)at the bifurcation of first diagonal branch .December 19,2011-decided to continue Plavix   . Hyperlipemia     LDL goal less than 70    Past Surgical History  Procedure Laterality Date  . Coronary angioplasty with stent placement      Current Outpatient Prescriptions  Medication Sig Dispense Refill  . aspirin 81 MG EC tablet Take 81 mg by mouth daily. Swallow whole.    . Calcium Carbonate-Vit D-Min (CALCIUM 1200 PO) Take by mouth. Three chewables once  daily    . clopidogrel (PLAVIX) 75 MG tablet TAKE 1 TABLET DAILY 90 tablet 1  . isosorbide mononitrate (IMDUR) 30 MG 24 hr tablet TAKE 1 TABLET DAILY 90 tablet 1  . KRILL OIL PO Take by mouth.  1 tab daily    . lisinopril (PRINIVIL,ZESTRIL) 20 MG tablet TAKE 1 TABLET TWICE A DAY 180 tablet 2  . metoprolol tartrate (LOPRESSOR) 25 MG tablet TAKE ONE-HALF (1/2) TABLET TWICE A DAY 180 tablet 0  . montelukast (SINGULAIR) 10 MG tablet 20 mg.     . Multiple Vitamin (MULTI-VITAMIN PO) Take by mouth. Take 1 tab daily    . simvastatin (ZOCOR) 20 MG tablet Take 1 tablet (20 mg total) by mouth every evening. 90 tablet 3   No current facility-administered medications for this visit.    Allergies:    Allergies  Allergen Reactions  . Augmentin [Amoxicillin-Pot Clavulanate] Diarrhea  . Ibuprofen     Upset stomach    Social History:  The patient  reports that she has never smoked. She does not have any smokeless tobacco history on file. She reports that she does not drink alcohol or use illicit drugs.   Family History  Problem Relation Age of Onset  . Diabetes Mother   . Hypertension Mother   . Coronary artery disease Mother   . Heart attack Father   .  Dementia Father   . Coronary artery disease Father     ROS:  Please see the history of present illness.   Denies any syncope, bleeding, orthopnea, PND   All other systems reviewed and negative.   PHYSICAL EXAM: VS:  BP 130/76 mmHg  Pulse 50  Ht 5' (1.524 m)  Wt 99 lb 8 oz (45.133 kg)  BMI 19.43 kg/m2 Thin, in no acute distress HEENT: normal, [place]/AT, EOMI Neck: no JVD, normal carotid upstroke, no bruit Cardiac:  normal S1, S2; RRR; no murmur Lungs:  clear to auscultation bilaterally, no wheezing, rhonchi or rales Abd: soft, nontender, no hepatomegaly, no bruits Ext: no edema, 2+ distal pulses Skin: warm and dry GU: deferred Neuro: no focal abnormalities noted, AAO x 3  EKG:  Today 03/16/15-sinus bradycardia rate 50 with possible  left atrial enlargement, no other abnormalities. Personally viewed-previous 03/09/14-sinus bradycardia rate 48 with no other significant abnormalities    Labs: 7/16-LDL 75, HDL 60, LFTs normal  ASSESSMENT AND PLAN:  1. Coronary artery disease-drug-eluting stent in LAD. Given the proximity of the LAD stent, we will go ahead and continue with Plavix. Aspirin. She is doing well without anginal symptoms. She is on very low-dose beta blockers. Unable to titrate because of bradycardia. Asymptomatic. Will discontinue Isosorbide. She will let us know she's having a symptoms. 2. Bradycardia-asymptomatic. Low-dose beta blocker. For protection. If heart rate gets too low, we will discontinue. 3. Hyperlipidemia-well-controlled on low-dose simvastatin. LDL 75. Optimal less than 70 4. Hypertension-well-controlled. 5. 1 year follow up.  Signed, Donato SchultzMark Yisel Megill, MD [facility]  03/16/2015 8:45 AM

## 2015-03-16 NOTE — Patient Instructions (Signed)
Medication Instructions:  Please discontinue your Imdur (Isosorbide). Continue all other medications as listed.  Follow-Up: Follow up in 1 year with Dr. [name].  You will receive a letter in the mail 2 months before you are due.  Please call us when you receive this letter to schedule your follow up appointment.  Thank you for choosing [facility]!!

## 2015-06-13 ENCOUNTER — Other Ambulatory Visit: Payer: Self-pay | Admitting: Cardiology

## 2015-07-06 ENCOUNTER — Other Ambulatory Visit: Payer: Self-pay | Admitting: Cardiology

## 2015-10-10 ENCOUNTER — Other Ambulatory Visit: Payer: Self-pay

## 2015-10-10 DIAGNOSIS — Z1231 Encounter for screening mammogram for malignant neoplasm of breast: Secondary | ICD-10-CM

## 2015-11-16 ENCOUNTER — Ambulatory Visit
Admission: RE | Admit: 2015-11-16 | Discharge: 2015-11-16 | Disposition: A | Discharge status: Home / Self Care | Payer: BLUE CROSS/BLUE SHIELD | Source: Ambulatory Visit

## 2015-11-16 DIAGNOSIS — Z1231 Encounter for screening mammogram for malignant neoplasm of breast: Secondary | ICD-10-CM

## 2015-12-08 ENCOUNTER — Telehealth: Payer: Self-pay | Admitting: Cardiology

## 2015-12-08 NOTE — Telephone Encounter (Signed)
Pt aware and will come fasting.

## 2015-12-08 NOTE — Telephone Encounter (Signed)
Just come fasting for the appointment. We will decide on labs at that point.  Donato SchultzSKAINS, Demaurion Dicioccio, MD

## 2015-12-08 NOTE — Telephone Encounter (Signed)
Pt is scheduled for 1 yr ROV 6/26 at 8am.  Will you want labs prior to her appt or OK to wait until day of?

## 2015-12-08 NOTE — Telephone Encounter (Signed)
Pt made yearly fu appt , wants to know if labs needed? pls call

## 2015-12-11 ENCOUNTER — Other Ambulatory Visit: Payer: Self-pay | Admitting: Cardiology

## 2016-02-19 NOTE — Progress Notes (Signed)
[address] [place], [place]  [address] Phone: [phone] Fax:  [phone]  Date:  02/26/2016   ID:  Allison Downs, DOB 11/26/1950, MRN [id number]  PCP:  Cain Saupe, MD   History of Present Illness: Allison Downs is a 65 y.o. female with history of CAD 12/11, DES to LAD at the bifurcation of the first diagonal branch who came back to the hospital the subsequent day and was ruled out for MI, and HTN.  She is doing very well. Active. Hiking. She has had an uneventful year. No anginal symptoms with moderate exertion. She is on both Plavix and aspirin. We had discussion about the possible discontinuation of Plavix however given the position of her stent essentially in the proximal LAD and smaller diameter of the stent a 2.75 mm I would like to continue it at this point. She's not had any bleeding episodes. we will continue.   Her blood pressure one point was elevated and we increased the lisinopril to 20 mg twice a day. Since then, she has been doing very well. she states that sometimes her blood pressure is elevated at the doctor's visit.    would like for her to be less than 70 on her LDL. Changing simvastatin 20 over to atorvastatin 20.  Patient denies chest pain, palpitations, dizziness, syncope, swelling, nor PND.      Wt Readings from Last 3 Encounters:  02/26/16 101 lb (45.813 kg)  03/16/15 99 lb 8 oz (45.133 kg)  03/09/14 97 lb 6.4 oz (44.18 kg)     Past Medical History  Diagnosis Date  . Hypertension   . Osteoporosis   . Coronary artery disease     drug-eluting stent to mid LAD (2.33mm)at the bifurcation of first diagonal branch .December 19,2011-decided to continue Plavix   . Hyperlipemia     LDL goal less than 70    Past Surgical History  Procedure Laterality Date  . Coronary angioplasty with stent placement      Current Outpatient Prescriptions  Medication Sig Dispense Refill  . aspirin 81 MG EC tablet Take 81 mg by mouth daily.  Swallow whole.    . Calcium Carbonate-Vit D-Min (CALCIUM 1200 PO) Take by mouth. Three chewables once daily    . clopidogrel (PLAVIX) 75 MG tablet Take 1 tablet (75 mg total) by mouth daily. 90 tablet 3  . denosumab (PROLIA) 60 MG/ML SOLN injection Inject 60 mg into the skin every 6 (six) months. Administer in upper arm, thigh, or abdomen    . KRILL OIL PO Take by mouth.  1 tab daily    . lisinopril (PRINIVIL,ZESTRIL) 20 MG tablet Take 1 tablet (20 mg total) by mouth 2 (two) times daily. 180 tablet 0  . metoprolol tartrate (LOPRESSOR) 25 MG tablet Take 0.5 tablets (12.5 mg total) by mouth 2 (two) times daily. 180 tablet 2  . montelukast (SINGULAIR) 10 MG tablet 20 mg.     . Multiple Vitamin (MULTI-VITAMIN PO) Take by mouth. Take 1 tab daily     No current facility-administered medications for this visit.    Allergies:    Allergies  Allergen Reactions  . Augmentin [Amoxicillin-Pot Clavulanate] Diarrhea  . Ibuprofen     Upset stomach    Social History:  The patient  reports that she has never smoked. She does not have any smokeless tobacco history on file. She reports that she does not drink alcohol or use illicit drugs.   Family History  Problem Relation Age of Onset  . Diabetes Mother   . Hypertension Mother   . Coronary artery disease Mother   . Heart attack Father   . Dementia Father   . Coronary artery disease Father     ROS:  Please see the history of present illness.   Denies any syncope, bleeding, orthopnea, PND   All other systems reviewed and negative.   PHYSICAL EXAM: VS:  BP 130/78 mmHg  Pulse 56  Ht 5' (1.524 m)  Wt 101 lb (45.813 kg)  BMI 19.73 kg/m2 Thin, in no acute distress HEENT: normal, [place]/AT, EOMI Neck: no JVD, normal carotid upstroke, no bruit Cardiac:  normal S1, S2; RRR; no murmur Lungs:  clear to auscultation bilaterally, no wheezing, rhonchi or rales Abd: soft, nontender, no hepatomegaly, no bruits Ext: no edema, 2+ distal pulses Skin: warm  and dry GU: deferred Neuro: no focal abnormalities noted, AAO x 3  EKG:  Today 02/26/16-sinus pericardial rate 56 with no other abnormalities personally viewed-prior 03/28/15-03/16/15-sinus bradycardia rate 50 with possible left atrial enlargement, no other abnormalities. Personally viewed-previous 03/09/14-sinus bradycardia rate 48 with no other significant abnormalities    Labs: 09/2015-LDL 82, LFTs normal  ASSESSMENT AND PLAN:  1. Coronary artery disease-drug-eluting stent in LAD 08/2010. Given the proximity of the LAD stent, we will go ahead and continue with Plavix. Aspirin. She is doing well without anginal symptoms. She is on very low-dose beta blockers. Unable to titrate because of bradycardia. Asymptomatic. Will discontinue Isosorbide. She will let us know she's having a symptoms. 2. Bradycardia-asymptomatic. Low-dose beta blocker. For protection. If heart rate gets too low, we will discontinue. 3. Hyperlipidemia-I would like to try to optimize control by changing simvastatin 20 over to atorvastatin 20. LDL 82. Optimal less than 70.  4. Hypertension-well-controlled. 5. 1 year follow up. Check lipid panel since changing medicine in 3 months, ALT in 3 months.  Signed, Donato SchultzMark Aseneth Hack, MD [facility]  02/26/2016 8:20 AM

## 2016-02-26 ENCOUNTER — Encounter: Payer: Self-pay | Admitting: Cardiology

## 2016-02-26 ENCOUNTER — Ambulatory Visit (INDEPENDENT_AMBULATORY_CARE_PROVIDER_SITE_OTHER): Payer: BLUE CROSS/BLUE SHIELD | Admitting: Cardiology

## 2016-02-26 VITALS — BP 130/78 | HR 56 | Ht 60.0 in | Wt 101.0 lb

## 2016-02-26 DIAGNOSIS — I2510 Atherosclerotic heart disease of native coronary artery without angina pectoris: Secondary | ICD-10-CM | POA: Diagnosis not present

## 2016-02-26 DIAGNOSIS — E785 Hyperlipidemia, unspecified: Secondary | ICD-10-CM | POA: Diagnosis not present

## 2016-02-26 DIAGNOSIS — I10 Essential (primary) hypertension: Secondary | ICD-10-CM

## 2016-02-26 DIAGNOSIS — Z79899 Other long term (current) drug therapy: Secondary | ICD-10-CM | POA: Diagnosis not present

## 2016-02-26 MED ORDER — ATORVASTATIN CALCIUM 20 MG PO TABS: 20.0000 mg | ORAL_TABLET | Freq: Every day | ORAL | Status: DC

## 2016-02-26 NOTE — Patient Instructions (Signed)
Medication Instructions:  Please stop your simvastatin and start atorvastatin 20 mg a day. Continue all other medications as listed.  Labwork: Please have blood work in 3 months. (Lipid/ALT)  Follow-Up: Follow up in 1 year with Dr. [name].  You will receive a letter in the mail 2 months before you are due.  Please call us when you receive this letter to schedule your follow up appointment.  If you need a refill on your cardiac medications before your next appointment, please call your pharmacy.  Thank you for choosing [facility]!!

## 2016-03-11 ENCOUNTER — Other Ambulatory Visit: Payer: Self-pay | Admitting: Cardiology

## 2016-03-19 ENCOUNTER — Other Ambulatory Visit: Payer: Self-pay | Admitting: Cardiology

## 2016-03-27 ENCOUNTER — Other Ambulatory Visit: Payer: Self-pay | Admitting: Family Medicine

## 2016-03-27 DIAGNOSIS — Z8249 Family history of ischemic heart disease and other diseases of the circulatory system: Secondary | ICD-10-CM

## 2016-04-01 ENCOUNTER — Ambulatory Visit
Admission: RE | Admit: 2016-04-01 | Discharge: 2016-04-01 | Disposition: A | Discharge status: Home / Self Care | Payer: BLUE CROSS/BLUE SHIELD | Source: Ambulatory Visit | Attending: Family Medicine | Admitting: Family Medicine

## 2016-04-01 DIAGNOSIS — Z8249 Family history of ischemic heart disease and other diseases of the circulatory system: Secondary | ICD-10-CM

## 2016-04-05 ENCOUNTER — Other Ambulatory Visit: Payer: BLUE CROSS/BLUE SHIELD

## 2016-04-08 ENCOUNTER — Other Ambulatory Visit: Payer: Self-pay | Admitting: Family Medicine

## 2016-04-08 DIAGNOSIS — Z8639 Personal history of other endocrine, nutritional and metabolic disease: Secondary | ICD-10-CM

## 2016-04-15 ENCOUNTER — Ambulatory Visit
Admission: RE | Admit: 2016-04-15 | Discharge: 2016-04-15 | Disposition: A | Discharge status: Home / Self Care | Payer: BLUE CROSS/BLUE SHIELD | Source: Ambulatory Visit | Attending: Family Medicine | Admitting: Family Medicine

## 2016-04-15 DIAGNOSIS — Z8639 Personal history of other endocrine, nutritional and metabolic disease: Secondary | ICD-10-CM

## 2016-04-17 ENCOUNTER — Other Ambulatory Visit: Payer: Self-pay | Admitting: Family Medicine

## 2016-04-17 ENCOUNTER — Telehealth: Payer: Self-pay | Admitting: Cardiology

## 2016-04-17 DIAGNOSIS — E041 Nontoxic single thyroid nodule: Secondary | ICD-10-CM

## 2016-04-18 NOTE — Telephone Encounter (Signed)
No note needed 

## 2016-04-19 ENCOUNTER — Telehealth: Payer: Self-pay | Admitting: Cardiology

## 2016-04-19 NOTE — Telephone Encounter (Signed)
New message       Request for surgical clearance:  1. What type of surgery is being performed? Thyroid biopsy  2. When is this surgery scheduled? Pending clearance  3. Are there any medications that need to be held prior to surgery and how long? Hold plavix 5 days prior to biopsy  4. Name of physician performing surgery?   5. What is your office phone and fax number?  Send clearance thru epic or fax to [phone] Irena Reichmannattn Keila

## 2016-04-19 NOTE — Telephone Encounter (Signed)
Error   Pt wants surgical clearance informed pt to have the office call us

## 2016-04-19 NOTE — Telephone Encounter (Signed)
WILL FORWARD TO DR  [name]  FOR  REVIEW .Allison Downs/CY

## 2016-04-22 ENCOUNTER — Telehealth: Payer: Self-pay | Admitting: Cardiology

## 2016-04-22 NOTE — Telephone Encounter (Signed)
Information printed and taken to MR to be faxed. 

## 2016-04-22 NOTE — Telephone Encounter (Signed)
Duplicate - please see prior telephone note

## 2016-04-22 NOTE — Telephone Encounter (Signed)
Okay to stop Plavix 5 days prior to surgery. Remote LAD stent. Please resume when comfortable from surgery perspective.  Donato SchultzMark Arcenio Mullaly, MD

## 2016-04-22 NOTE — Telephone Encounter (Signed)
error 

## 2016-04-22 NOTE — Telephone Encounter (Signed)
F/u     Pt calling back about the med clearance. Please call.

## 2016-04-22 NOTE — Telephone Encounter (Signed)
Called to review orders with patient.  She reports she already knows because the MD's office that is doing the procedure has already called her.  She is aware to restart Plavix as soon as MD states it is safe.

## 2016-04-22 NOTE — Telephone Encounter (Signed)
Follow up  Request for surgical clearance:  1. What type of surgery is being performed? Thyroid Biopsy  2. When is this surgery scheduled? Pending Clearance  3. Are there any medications that need to be held prior to surgery and how long? Pt has stopped taking Plavix due to MD will approve for surgery  4. Name of physician performing surgery?    5. What is your office phone and fax number? Send clearance thru epic or fax to [phone] attn Keila   Pt voiced Ferdinand LangoKeila spoke with Lupita Leashonna, MD [facility] awaiting approval stop taking Plavix.   Pt voiced she has already stopped Plavix knowing pending approval from MD.  Wynona Caneshristine [facility].  Please follow up with pt. Thanks!

## 2016-04-30 ENCOUNTER — Ambulatory Visit
Admission: RE | Admit: 2016-04-30 | Discharge: 2016-04-30 | Disposition: A | Discharge status: Home / Self Care | Payer: BLUE CROSS/BLUE SHIELD | Source: Ambulatory Visit | Attending: Family Medicine | Admitting: Family Medicine

## 2016-04-30 ENCOUNTER — Other Ambulatory Visit (HOSPITAL_COMMUNITY)
Admission: RE | Admit: 2016-04-30 | Discharge: 2016-04-30 | Disposition: A | Discharge status: Home / Self Care | Payer: BLUE CROSS/BLUE SHIELD | Source: Ambulatory Visit | Attending: Physician Assistant | Admitting: Physician Assistant

## 2016-04-30 DIAGNOSIS — E041 Nontoxic single thyroid nodule: Secondary | ICD-10-CM | POA: Insufficient documentation

## 2016-04-30 NOTE — Procedures (Signed)
Using direct ultrasound guidance, 4 passes were made using 25 g needles into the nodule within the right lobe of the thyroid.   Ultrasound was used to confirm needle placements on all occasions.   Specimens were sent to Pathology for analysis.   Toniann FailWENDY S Majesty Oehlert PA-C 04/30/2016 2:47 PM

## 2016-05-01 ENCOUNTER — Other Ambulatory Visit: Payer: BLUE CROSS/BLUE SHIELD

## 2016-05-28 ENCOUNTER — Other Ambulatory Visit: Payer: BLUE CROSS/BLUE SHIELD | Admitting: *Deleted

## 2016-05-28 DIAGNOSIS — Z79899 Other long term (current) drug therapy: Secondary | ICD-10-CM

## 2016-05-28 DIAGNOSIS — E785 Hyperlipidemia, unspecified: Secondary | ICD-10-CM

## 2016-05-28 LAB — ALT: ALT: 20 U/L (ref 6–29)

## 2016-05-28 LAB — LIPID PANEL
CHOLESTEROL: 146 mg/dL (ref 125–200)
HDL: 66 mg/dL (ref >=46)
LDL Cholesterol: 65 mg/dL (ref <130)
TRIGLYCERIDES: 77 mg/dL (ref <150)
Total CHOL/HDL Ratio: 2.2 Ratio (ref <=5.0)
VLDL: 15 mg/dL (ref <30)

## 2016-05-29 ENCOUNTER — Telehealth: Payer: Self-pay | Admitting: Cardiology

## 2016-05-29 NOTE — Telephone Encounter (Signed)
F/u message ° °Pt returning RN call. Please call back to discuss  °

## 2016-05-29 NOTE — Telephone Encounter (Signed)
Attempted to call pt back however the phone rang, someone picked it up and then the call was disconnected without saying anything.  Attempted to c/b and call went straight to vm.

## 2016-05-30 NOTE — Telephone Encounter (Signed)
Follow up ° ° °Pt verbalized that she is returning call for rn °

## 2016-05-30 NOTE — Telephone Encounter (Signed)
Pt is aware of lab results.

## 2016-07-08 ENCOUNTER — Other Ambulatory Visit: Payer: Self-pay | Admitting: Cardiology

## 2016-09-30 ENCOUNTER — Other Ambulatory Visit: Payer: Self-pay

## 2016-09-30 ENCOUNTER — Other Ambulatory Visit (HOSPITAL_COMMUNITY)
Admission: RE | Admit: 2016-09-30 | Discharge: 2016-09-30 | Disposition: A | Discharge status: Home / Self Care | Payer: BLUE CROSS/BLUE SHIELD | Source: Ambulatory Visit | Attending: Pediatrics | Admitting: Pediatrics

## 2016-09-30 DIAGNOSIS — Z01419 Encounter for gynecological examination (general) (routine) without abnormal findings: Secondary | ICD-10-CM | POA: Insufficient documentation

## 2016-10-03 LAB — CYTOLOGY - PAP: DIAGNOSIS: NEGATIVE

## 2016-10-16 ENCOUNTER — Other Ambulatory Visit: Payer: Self-pay | Admitting: Internal Medicine

## 2016-10-16 ENCOUNTER — Other Ambulatory Visit: Payer: Self-pay | Admitting: Family Medicine

## 2016-10-16 DIAGNOSIS — Z1231 Encounter for screening mammogram for malignant neoplasm of breast: Secondary | ICD-10-CM

## 2016-10-16 DIAGNOSIS — M810 Age-related osteoporosis without current pathological fracture: Secondary | ICD-10-CM

## 2016-11-19 ENCOUNTER — Ambulatory Visit
Admission: RE | Admit: 2016-11-19 | Discharge: 2016-11-19 | Disposition: A | Discharge status: Home / Self Care | Payer: BLUE CROSS/BLUE SHIELD | Source: Ambulatory Visit | Attending: Internal Medicine | Admitting: Internal Medicine

## 2016-11-19 DIAGNOSIS — M810 Age-related osteoporosis without current pathological fracture: Secondary | ICD-10-CM

## 2016-11-19 DIAGNOSIS — Z1231 Encounter for screening mammogram for malignant neoplasm of breast: Secondary | ICD-10-CM

## 2016-11-20 ENCOUNTER — Other Ambulatory Visit: Payer: Self-pay | Admitting: *Deleted

## 2016-11-20 MED ORDER — CLOPIDOGREL BISULFATE 75 MG PO TABS: 75.0000 mg | ORAL_TABLET | Freq: Every day | ORAL | 0 refills | Status: DC

## 2016-12-05 ENCOUNTER — Other Ambulatory Visit: Payer: Self-pay

## 2016-12-05 MED ORDER — LISINOPRIL 20 MG PO TABS: 20.0000 mg | ORAL_TABLET | Freq: Two times a day (BID) | ORAL | 0 refills | Status: DC

## 2016-12-31 ENCOUNTER — Encounter: Payer: Self-pay | Admitting: Cardiology

## 2016-12-31 ENCOUNTER — Ambulatory Visit (INDEPENDENT_AMBULATORY_CARE_PROVIDER_SITE_OTHER): Payer: BLUE CROSS/BLUE SHIELD | Admitting: Cardiology

## 2016-12-31 VITALS — BP 115/78 | HR 56 | Ht 60.0 in | Wt 95.0 lb

## 2016-12-31 DIAGNOSIS — I10 Essential (primary) hypertension: Secondary | ICD-10-CM

## 2016-12-31 DIAGNOSIS — I2510 Atherosclerotic heart disease of native coronary artery without angina pectoris: Secondary | ICD-10-CM | POA: Diagnosis not present

## 2016-12-31 DIAGNOSIS — R001 Bradycardia, unspecified: Secondary | ICD-10-CM

## 2016-12-31 DIAGNOSIS — E7800 Pure hypercholesterolemia, unspecified: Secondary | ICD-10-CM

## 2016-12-31 NOTE — Patient Instructions (Signed)
Medication Instructions:  The current medical regimen is effective;  continue present plan and medications.  Follow-Up: Follow up as needed with Dr Skains.  Thank you for choosing [facility]!!     

## 2016-12-31 NOTE — Progress Notes (Signed)
[address] [place], [place]  [address] Phone: [phone] Fax:  [phone]  Date:  12/31/2016   ID:  Allison Downs, DOB 03-Jan-1951, MRN [id number]  PCP:  Boneta Lucks, NP   History of Present Illness: Allison Downs is a 66 y.o. female with history of coronary artery disease in December 2011 with drug eluding stent placed to the LAD at the bifurcation of the first diagonal branch who was readmitted the subsequent day and ruled out for myocardial infarction here for follow-up.  Given the size of the stent 2.75 mm we are continuing her Plavix. She has not had any bleeding issues. Overall doing quite well.   She had thyroid biopsy and held her Plavix without any difficulty.   Her blood pressure one point was elevated and we increased the lisinopril to 20 mg twice a day. Since then, she has been doing very well. she states that sometimes her blood pressure is elevated at the doctor's visit.   She denies any chest pain, syncope, bleeding, orthopnea, PND, fevers.  Retire January 31 2017. Moved to [facility] (kids in [place], [place]). She is excited.    Wt Readings from Last 3 Encounters:  12/31/16 95 lb (43.1 kg)  02/26/16 101 lb (45.8 kg)  03/16/15 99 lb 8 oz (45.1 kg)     Past Medical History:  Diagnosis Date  . Coronary artery disease    drug-eluting stent to mid LAD (2.69mm)at the bifurcation of first diagonal branch .December 19,2011-decided to continue Plavix   . Hyperlipemia    LDL goal less than 70  . Hypertension   . Osteoporosis     Past Surgical History:  Procedure Laterality Date  . CORONARY ANGIOPLASTY WITH STENT PLACEMENT      Current Outpatient Prescriptions  Medication Sig Dispense Refill  . aspirin 81 MG EC tablet Take 81 mg by mouth daily. Swallow whole.    Marland Kitchen atorvastatin (LIPITOR) 20 MG tablet Take 1 tablet (20 mg total) by mouth daily. 90 tablet 3  . Calcium Carbonate-Vit D-Min (CALCIUM 1200 PO) Take by mouth. Three chewables  once daily    . clopidogrel (PLAVIX) 75 MG tablet Take 1 tablet (75 mg total) by mouth daily. *Please call and schedule a one year follow up appointment* 90 tablet 0  . KRILL OIL PO Take by mouth.  1 tab daily    . lisinopril (PRINIVIL,ZESTRIL) 20 MG tablet Take 1 tablet (20 mg total) by mouth 2 (two) times daily. Please keep 12/31/16 appt for further refills. 180 tablet 0  . metoprolol tartrate (LOPRESSOR) 25 MG tablet TAKE ONE-HALF (1/2) TABLET TWICE A DAY 90 tablet 3  . montelukast (SINGULAIR) 10 MG tablet Take 10 mg by mouth at bedtime.     . Multiple Vitamin (MULTI-VITAMIN PO) Take by mouth. Take 1 tab daily     No current facility-administered medications for this visit.     Allergies:    Allergies  Allergen Reactions  . Augmentin [Amoxicillin-Pot Clavulanate] Diarrhea  . Ibuprofen     Upset stomach    Social History:  The patient  reports that she has never smoked. She has never used smokeless tobacco. She reports that she does not drink alcohol or use drugs.   Family History  Problem Relation Age of Onset  . Diabetes Mother   . Hypertension Mother   . Coronary artery disease Mother   . Heart attack Father   . Dementia Father   .  Coronary artery disease Father   . Breast cancer Neg Hx     ROS:  Please see the history of present illness.   Denies any syncope, bleeding, orthopnea, PND   Unless stated above all other review of systems negative.   PHYSICAL EXAM: VS:  BP 115/78   Pulse (!) 56   Ht 5' (1.524 m)   Wt 95 lb (43.1 kg)   BMI 18.55 kg/m  GEN: Thin, in no acute distress  HEENT: normal  Neck: no JVD, carotid bruits, or masses Cardiac:RRR; no murmurs, rubs, or gallops,no edema  Respiratory:  clear to auscultation bilaterally, normal work of breathing GI: soft, nontender, nondistended, + BS MS: no deformity or atrophy  Skin: warm and dry, no rash Neuro:  Alert and Oriented x 3, Strength and sensation are intact Psych: euthymic mood, full affect   EKG:   Today 12/31/16-sinus bradycardia rate 56 with no other abnormalities. Personally viewed. 02/26/16-sinus pericardial rate 56 with no other abnormalities personally viewed-prior 03/28/15-03/16/15-sinus bradycardia rate 50 with possible left atrial enlargement, no other abnormalities. Personally viewed-previous 03/09/14-sinus bradycardia rate 48 with no other significant abnormalities      ASSESSMENT AND PLAN:  Coronary artery disease  - DES to LAD in December 2011, proximal  - Continue with Plavix, ASA. One could consider Plavix alone. I decided to continue the Plavix given the proximal nature of her LAD stent and small diameter of 2.7 mm.  - Low-dose beta blocker. Prior bradycardia.  - No longer having angina, stopped isosorbide previously.  Bradycardia  - Asymptomatic. Low-dose beta blocker.  Hyperlipidemia  - Changed simvastatin to atorvastatin previously.  - LDL 65 on last check 05/28/16. ALT 20.  Essential hypertension  - Currently well controlled  Since she has moved to [facility] and is retiring, she will establish cardiology care in that region. We will transfer any records that are necessary.  Signed, Donato Schultz, MD [facility]  12/31/2016 8:53 AM

## 2017-02-12 ENCOUNTER — Other Ambulatory Visit: Payer: Self-pay | Admitting: Cardiology

## 2017-02-12 MED ORDER — CLOPIDOGREL BISULFATE 75 MG PO TABS: 75.0000 mg | ORAL_TABLET | Freq: Every day | ORAL | 3 refills | Status: AC

## 2017-02-12 MED ORDER — LISINOPRIL 20 MG PO TABS: 20.0000 mg | ORAL_TABLET | Freq: Two times a day (BID) | ORAL | 3 refills | Status: AC

## 2017-02-12 MED ORDER — ATORVASTATIN CALCIUM 20 MG PO TABS: 20.0000 mg | ORAL_TABLET | Freq: Every day | ORAL | 3 refills | Status: AC

## 2017-02-12 MED ORDER — METOPROLOL TARTRATE 25 MG PO TABS: ORAL_TABLET | ORAL | 3 refills | Status: AC

## 2017-03-27 IMAGING — US US CAROTID DUPLEX BILAT
1 series · 13 of 24 positions shown · non-contrast
Comparison: None.

CLINICAL DATA: History of CAD (post coronary artery stent
placement), hypertension and hyperlipidemia.

EXAM:
BILATERAL CAROTID DUPLEX ULTRASOUND
TECHNIQUE: Gray scale imaging, color Doppler and duplex ultrasound were
performed of bilateral carotid and vertebral arteries in the neck.

[Series 1: us carotid duplex bilat · 0.06mm/px · 13 of 76 slices shown]
[im 1/76]
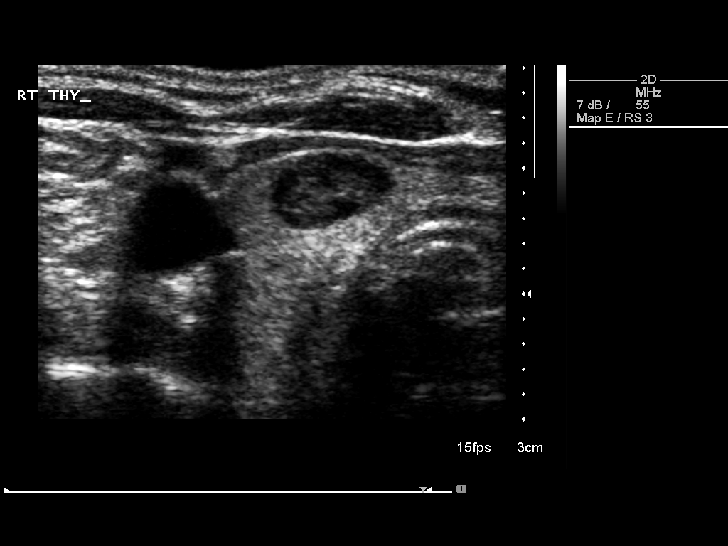
[im 7/76]
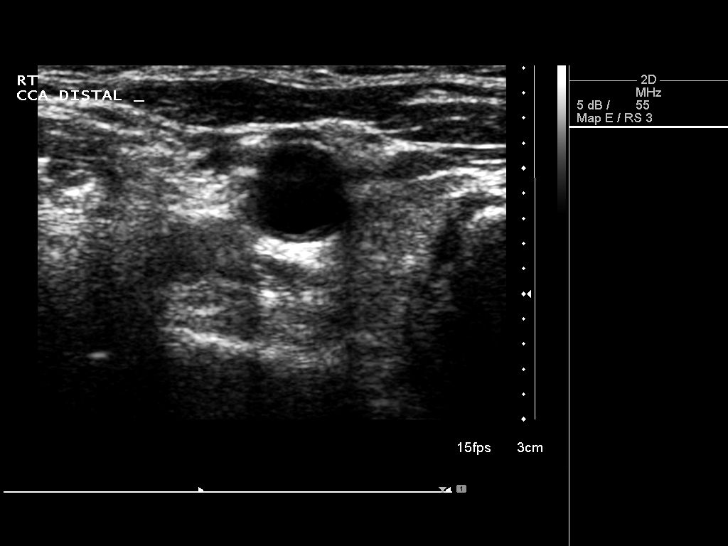
[im 14/76]
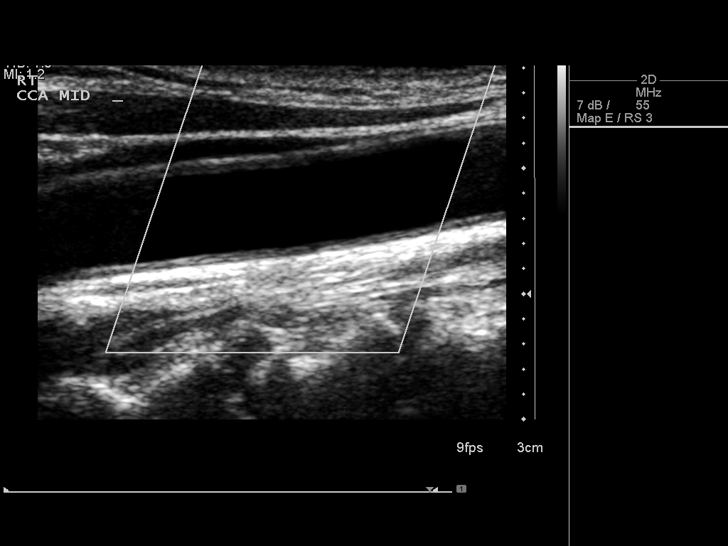
[im 20/76]
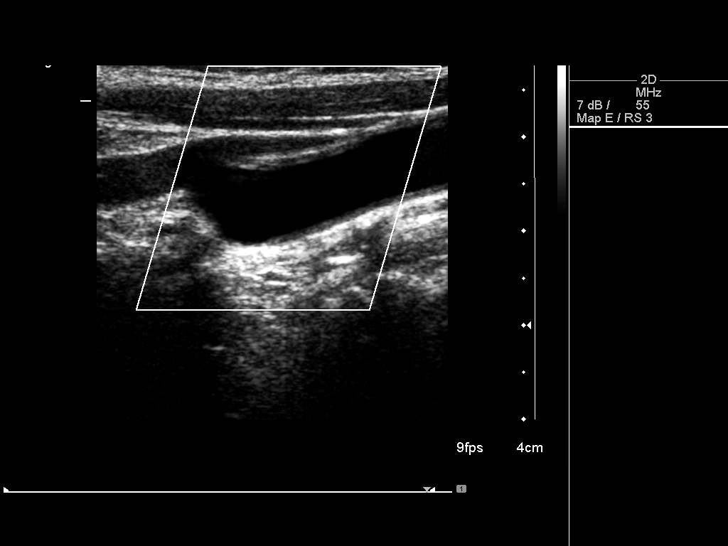
[im 27/76]
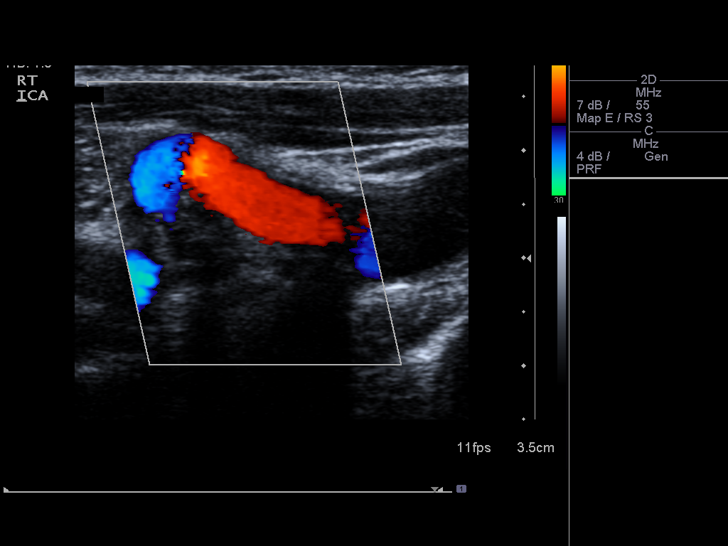
[im 33/76]
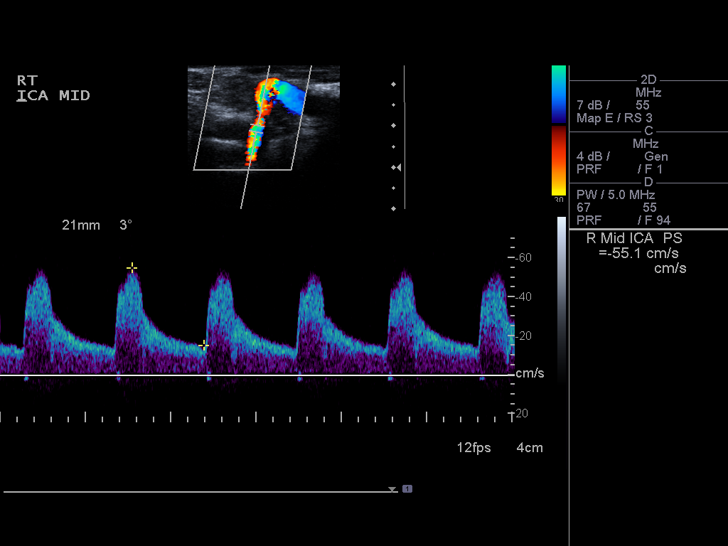
[im 40/76]
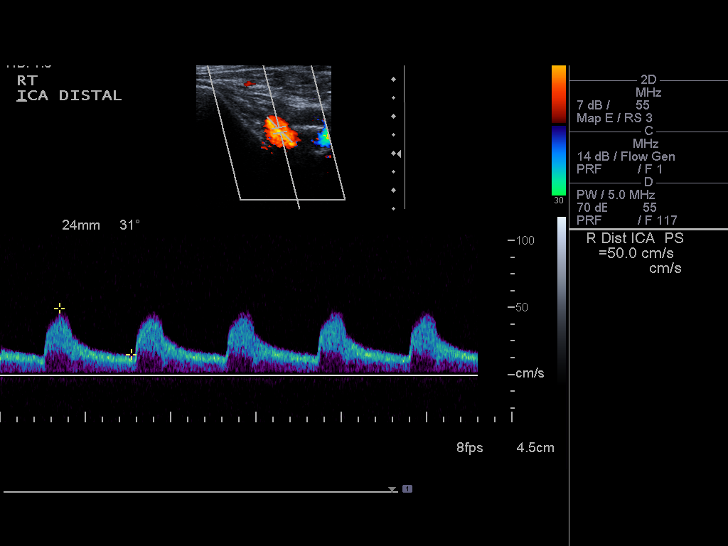
[im 43/76]
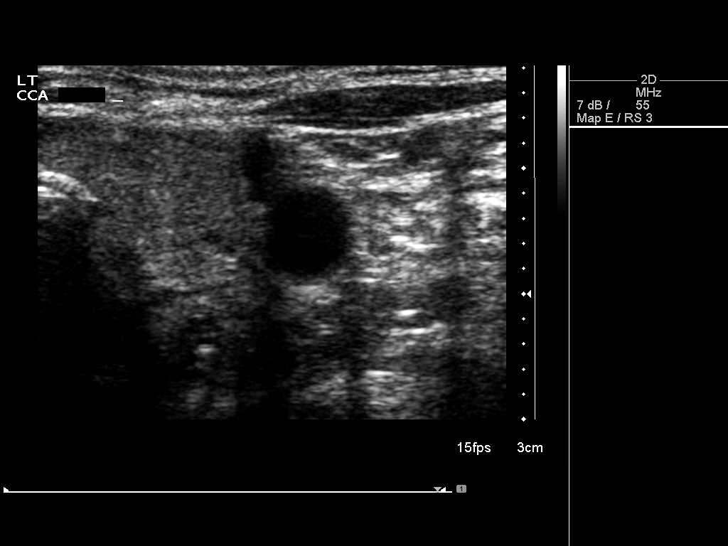
[im 49/76]
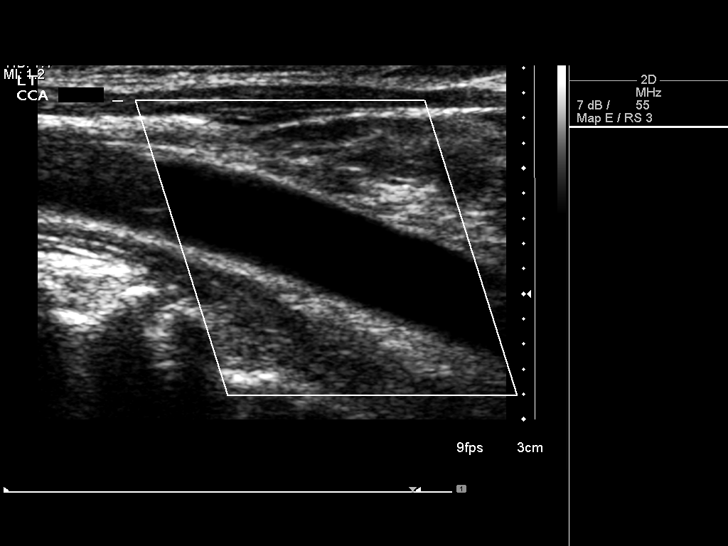
[im 56/76]
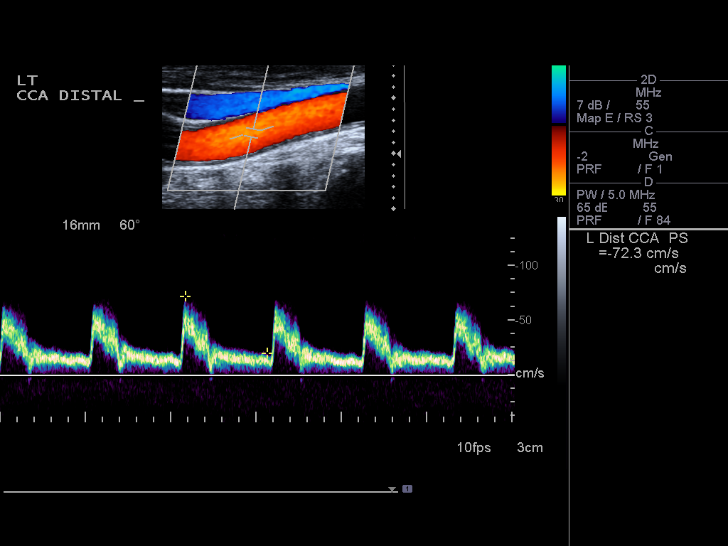
[im 62/76]
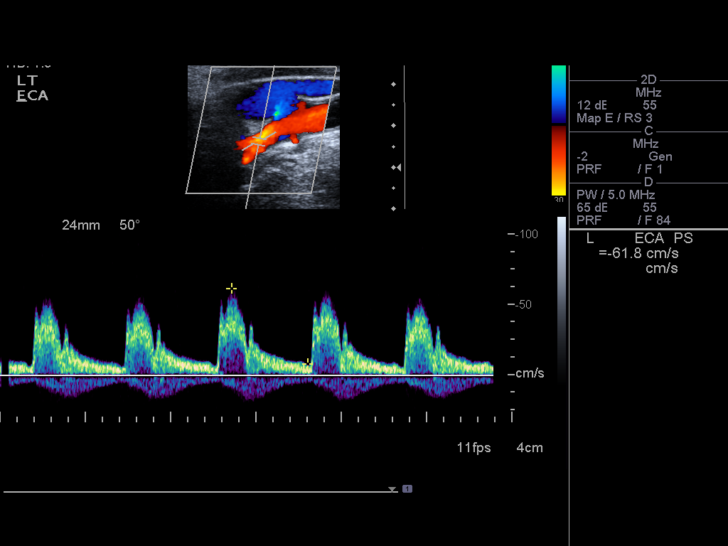
[im 69/76]
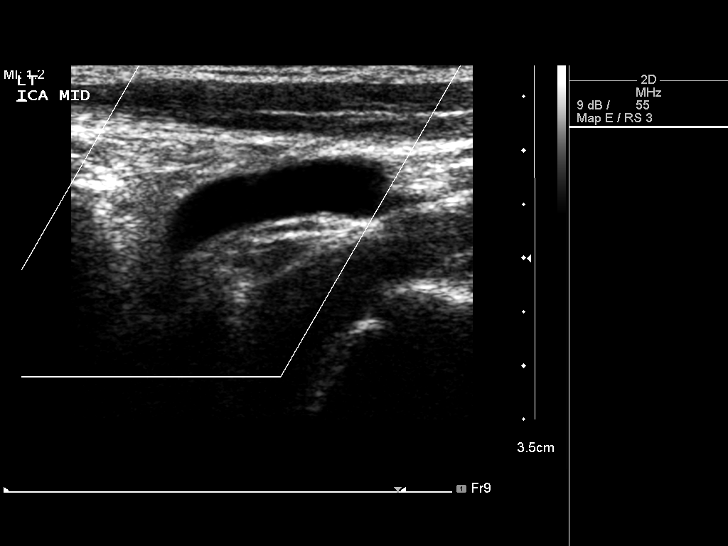
[im 76/76]
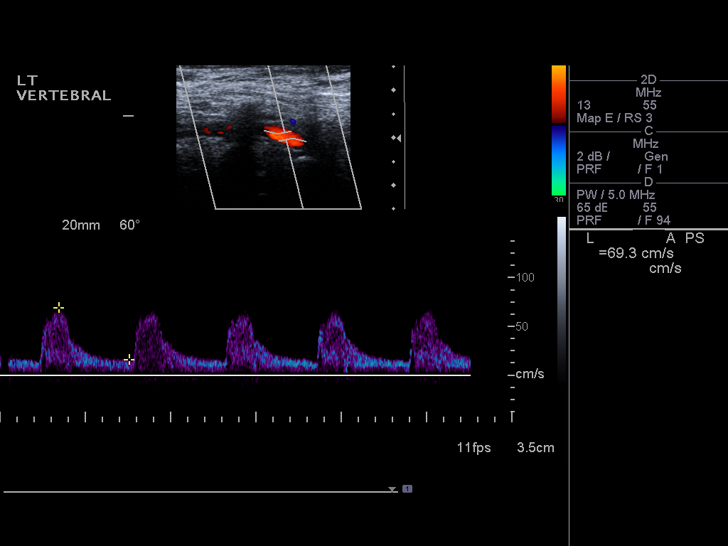

[13 of 24 positions shown; findings below may reference images not displayed]

FINDINGS: Criteria: Quantification of carotid stenosis is based on velocity
parameters that correlate the residual internal carotid diameter
with NASCET-based stenosis levels, using the diameter of the distal
internal carotid lumen as the denominator for stenosis measurement.

The following velocity measurements were obtained:

RIGHT

ICA:  65/18 cm/sec

CCA:  74/15 cm/sec

SYSTOLIC ICA/CCA RATIO:

DIASTOLIC ICA/CCA RATIO:

ECA:  56 cm/sec

LEFT

ICA:  63/19 cm/sec

CCA:  76/15 cm/sec

SYSTOLIC ICA/CCA RATIO:

DIASTOLIC ICA/CCA RATIO:

ECA:  62 cm/sec

RIGHT CAROTID ARTERY: The right internal carotid artery is noted to
be tortuous (image 28). There is no grayscale evidence of
significant intimal thickening or atherosclerotic plaque affecting
the interrogated portions of the right carotid system. There are no
elevated peak systolic velocities within the right internal carotid
artery to suggest a hemodynamically significant stenosis.

RIGHT VERTEBRAL ARTERY:  Antegrade Flow

LEFT CAROTID ARTERY: There is a minimal amount of eccentric
hypoechoic plaque / intimal wall thickening within the left carotid
bulb (image 59), not resulting in elevated peak systolic velocities
within the interrogated course of the left internal carotid artery
to suggest a hemodynamically significant stenosis.

LEFT VERTEBRAL ARTERY:  Antegrade Flow

Note is made of an approximately 1.0 x 1.4 x 0.6 cm mixed echogenic,
partially cystic, partially solid nodule within the incidentally
imaged right lobe of the thyroid.
IMPRESSION: 1. Minimal amount of hypoechoic plaque/intimal wall thickening
within the left carotid bulb, not resulting in a hemodynamically
significant stenosis.
2. Tortuosity of the right internal carotid artery. Otherwise,
unremarkable sonographic evaluation of the right carotid system.
3. Incidentally noted approximately 1.4 cm partially cystic /
partially solid nodule within the right lobe of the thyroid. Further
evaluation with dedicated thyroid ultrasound could be performed as
clinically indicated.

## 2017-04-25 IMAGING — US US THYROID BIOPSY
1 series · 14 of 16 positions shown · non-contrast
Comparison: Ultrasound done 04/15/2016

INDICATION: Right thyroid nodule measuring 1.8 x 1.1 x 1 cm. Request for
ultrasound guided fine needle aspirate biopsy.

EXAM:
ULTRASOUND GUIDED NEEDLE ASPIRATE BIOPSY OF THE THYROID GLAND
MEDICATIONS:
1% Lidocaine.
ANESTHESIA/SEDATION:
No sedation medications given.

[Series 1: us thyroid biopsy · 0.07mm/px · 16 acquisitions, 14 frames shown]
[im 1/16]
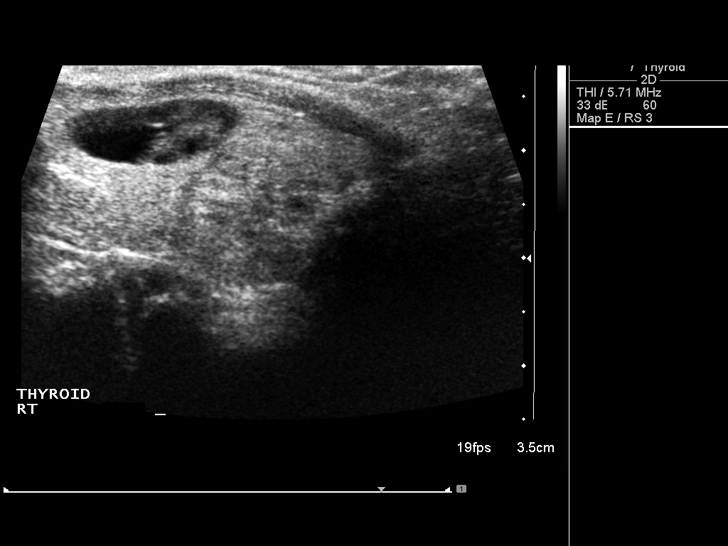
[im 2/16]
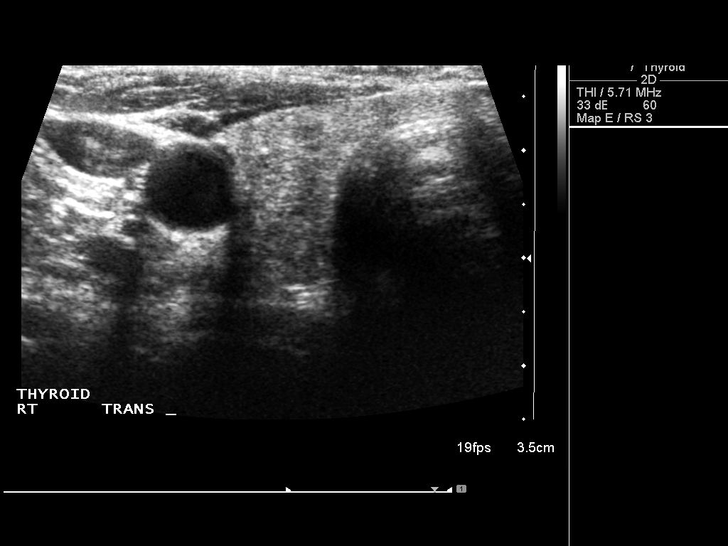
[im 3/16]
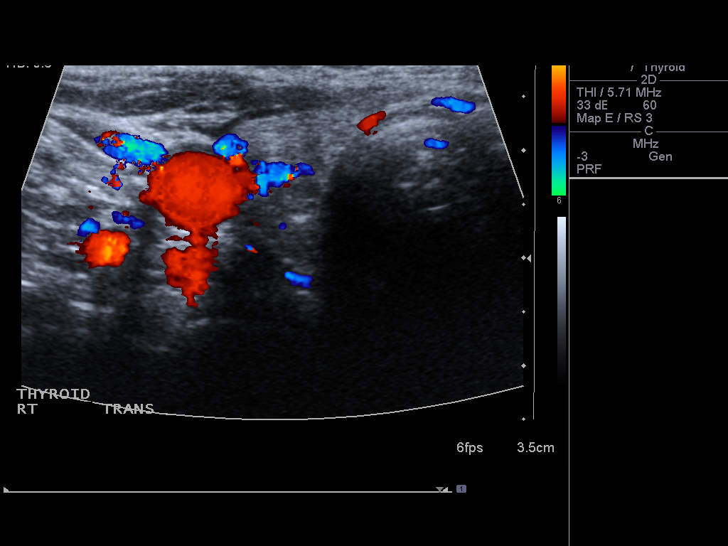
[im 5/16]
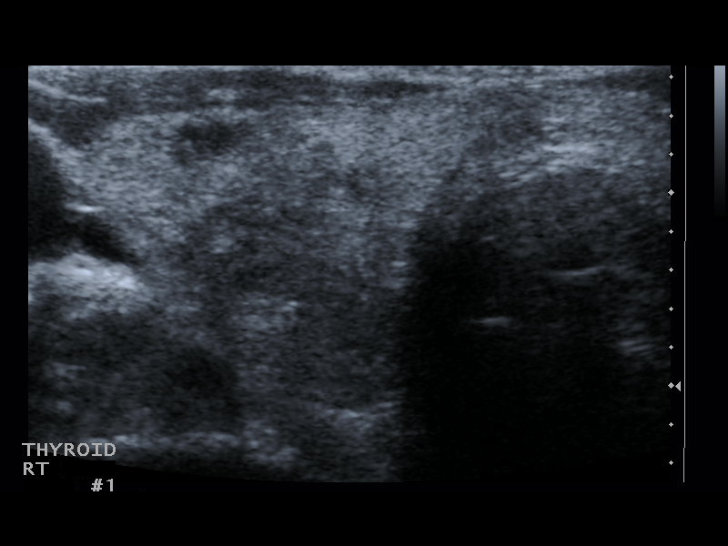
[im 6/16]
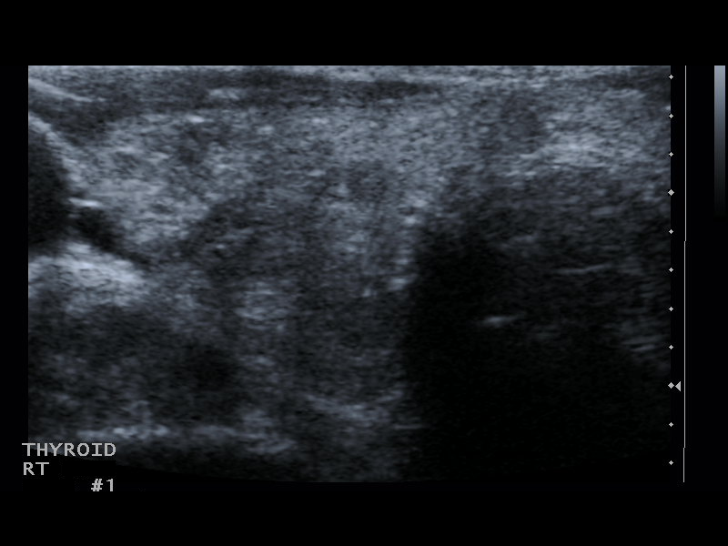
[im 7/16]
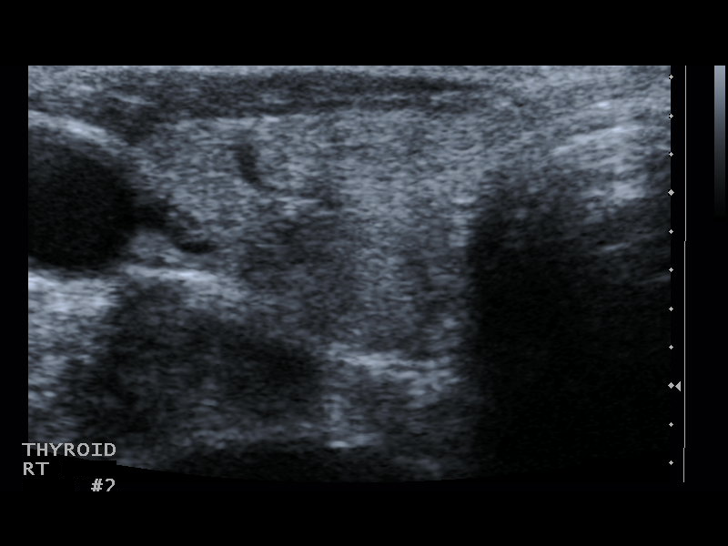
[im 8/16]
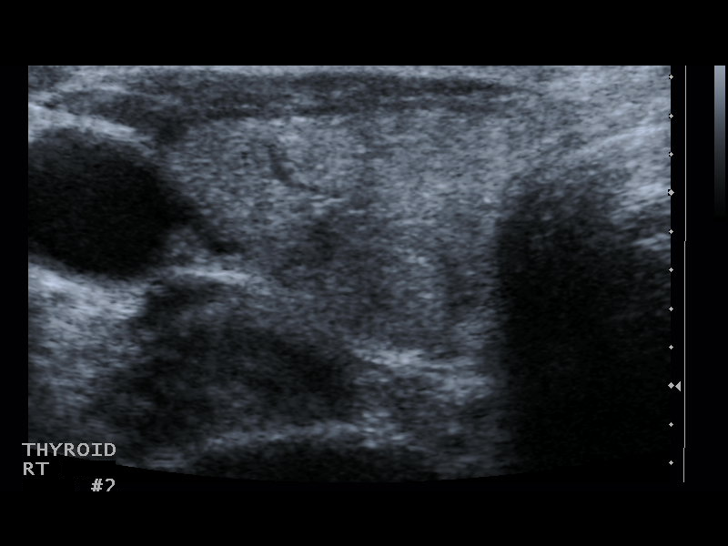
[im 9/16]
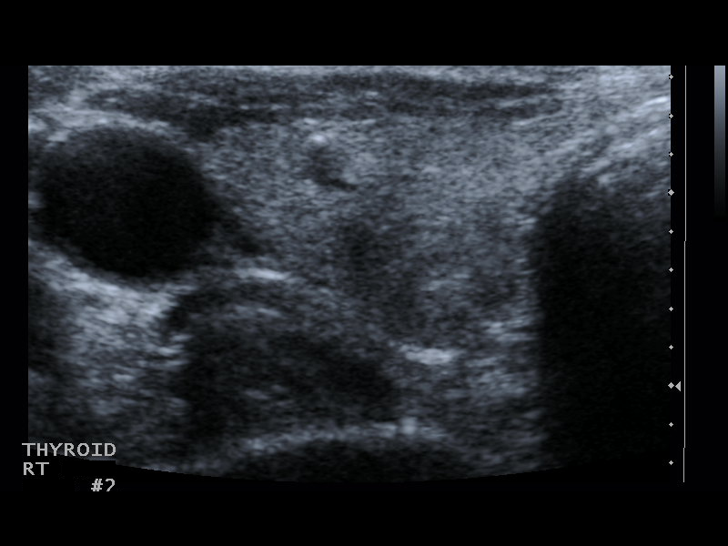
[im 10/16]
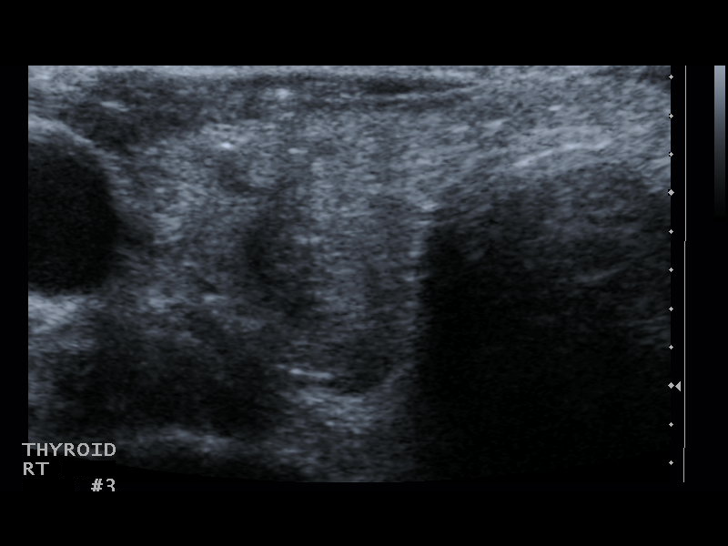
[im 11/16]
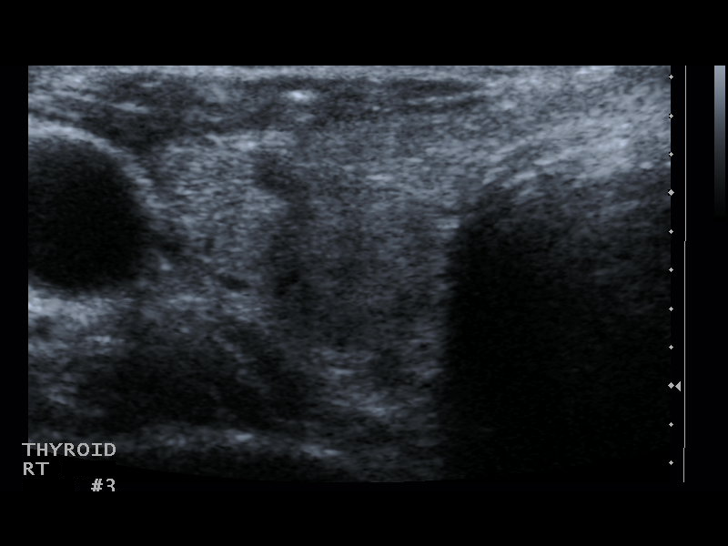
[im 13/16]
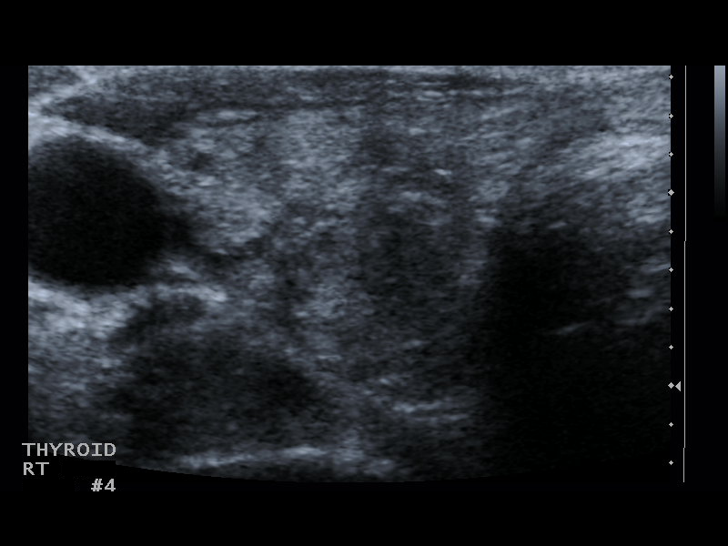
[im 14/16]
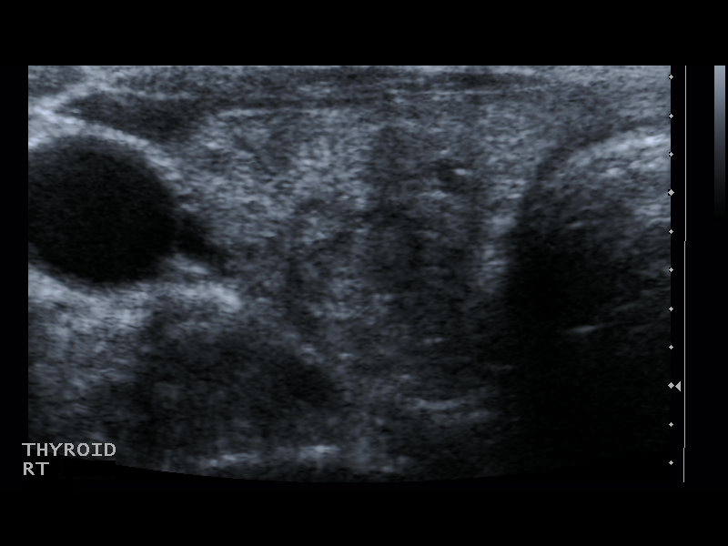
[im 15/16]
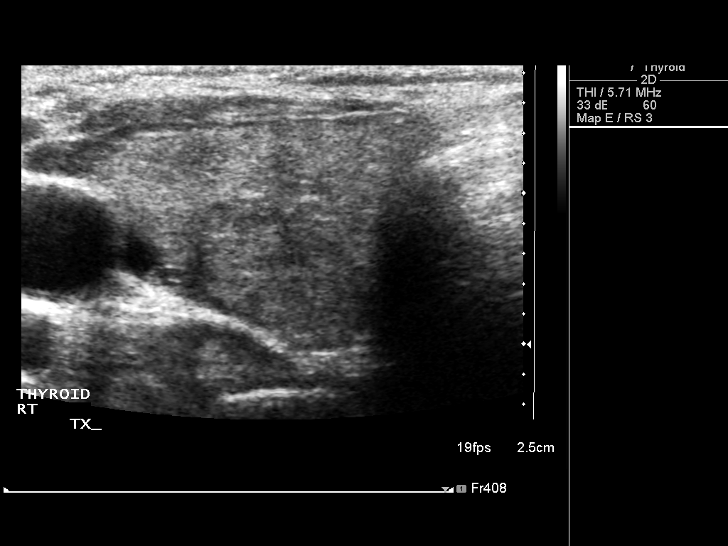
[im 16/16]
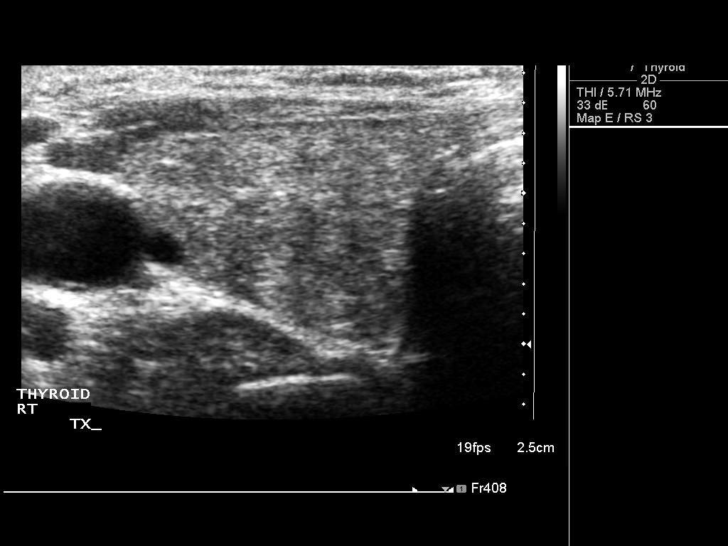

[14 of 16 positions shown; findings below may reference images not displayed]

COMPLICATIONS:
None immediate.

PROCEDURE:
Thyroid biopsy was thoroughly discussed with the patient and
questions were answered. The benefits, risks, alternatives, and
complications were also discussed. The patient understands and
wishes to proceed with the procedure. Written consent was obtained.

Ultrasound was performed to localize and mark an adequate site for
the biopsy. The patient was then prepped and draped in a normal
sterile fashion. Local anesthesia was provided with 1% lidocaine.
Using direct ultrasound guidance, 4 passes were made using 25 gauge
needles into the nodule within the right lobe of the thyroid.
Ultrasound was used to confirm needle placements on all occasions.
Specimens were sent to Pathology for analysis.
IMPRESSION: Ultrasound guided needle aspirate biopsy performed of the right
thyroid nodule.
